# Patient Record
Sex: Female | Born: 1955 | Race: White | Hispanic: No | Marital: Single | Smoking: Former smoker
Health system: Southern US, Community
[De-identification: ages and names within clinical notes are randomized; demographics above are authoritative.]

## PROBLEM LIST (undated history)

## (undated) DIAGNOSIS — I10 Essential (primary) hypertension: Secondary | ICD-10-CM

## (undated) DIAGNOSIS — I219 Acute myocardial infarction, unspecified: Secondary | ICD-10-CM

## (undated) DIAGNOSIS — M1990 Unspecified osteoarthritis, unspecified site: Secondary | ICD-10-CM

## (undated) DIAGNOSIS — I519 Heart disease, unspecified: Secondary | ICD-10-CM

## (undated) DIAGNOSIS — E119 Type 2 diabetes mellitus without complications: Secondary | ICD-10-CM

## (undated) HISTORY — PX: CHOLECYSTECTOMY: SHX55

## (undated) HISTORY — PX: CARDIAC CATHETERIZATION: SHX172

---

## 2017-03-02 ENCOUNTER — Encounter: Payer: Self-pay | Admitting: Emergency Medicine

## 2017-03-02 ENCOUNTER — Other Ambulatory Visit: Payer: Self-pay

## 2017-03-02 ENCOUNTER — Emergency Department: Payer: BLUE CROSS/BLUE SHIELD

## 2017-03-02 ENCOUNTER — Inpatient Hospital Stay
Admission: EM | Admit: 2017-03-02 | Discharge: 2017-03-11 | DRG: 193 | Disposition: A | Discharge status: Home / Self Care | Payer: BLUE CROSS/BLUE SHIELD | Attending: Specialist | Admitting: Specialist

## 2017-03-02 DIAGNOSIS — J189 Pneumonia, unspecified organism: Secondary | ICD-10-CM | POA: Diagnosis present

## 2017-03-02 DIAGNOSIS — I10 Essential (primary) hypertension: Secondary | ICD-10-CM | POA: Diagnosis present

## 2017-03-02 DIAGNOSIS — Z7984 Long term (current) use of oral hypoglycemic drugs: Secondary | ICD-10-CM

## 2017-03-02 DIAGNOSIS — Z7982 Long term (current) use of aspirin: Secondary | ICD-10-CM

## 2017-03-02 DIAGNOSIS — J918 Pleural effusion in other conditions classified elsewhere: Secondary | ICD-10-CM | POA: Diagnosis present

## 2017-03-02 DIAGNOSIS — J440 Chronic obstructive pulmonary disease with acute lower respiratory infection: Secondary | ICD-10-CM | POA: Diagnosis present

## 2017-03-02 DIAGNOSIS — E119 Type 2 diabetes mellitus without complications: Secondary | ICD-10-CM | POA: Diagnosis present

## 2017-03-02 DIAGNOSIS — J9601 Acute respiratory failure with hypoxia: Secondary | ICD-10-CM

## 2017-03-02 DIAGNOSIS — I2510 Atherosclerotic heart disease of native coronary artery without angina pectoris: Secondary | ICD-10-CM | POA: Diagnosis present

## 2017-03-02 DIAGNOSIS — Z79899 Other long term (current) drug therapy: Secondary | ICD-10-CM

## 2017-03-02 DIAGNOSIS — Z794 Long term (current) use of insulin: Secondary | ICD-10-CM

## 2017-03-02 DIAGNOSIS — Z955 Presence of coronary angioplasty implant and graft: Secondary | ICD-10-CM

## 2017-03-02 DIAGNOSIS — E785 Hyperlipidemia, unspecified: Secondary | ICD-10-CM | POA: Diagnosis present

## 2017-03-02 DIAGNOSIS — J441 Chronic obstructive pulmonary disease with (acute) exacerbation: Secondary | ICD-10-CM | POA: Diagnosis present

## 2017-03-02 DIAGNOSIS — I252 Old myocardial infarction: Secondary | ICD-10-CM

## 2017-03-02 DIAGNOSIS — Z09 Encounter for follow-up examination after completed treatment for conditions other than malignant neoplasm: Secondary | ICD-10-CM

## 2017-03-02 DIAGNOSIS — J90 Pleural effusion, not elsewhere classified: Secondary | ICD-10-CM

## 2017-03-02 DIAGNOSIS — J181 Lobar pneumonia, unspecified organism: Secondary | ICD-10-CM

## 2017-03-02 DIAGNOSIS — Z87891 Personal history of nicotine dependence: Secondary | ICD-10-CM

## 2017-03-02 DIAGNOSIS — I4891 Unspecified atrial fibrillation: Secondary | ICD-10-CM | POA: Diagnosis present

## 2017-03-02 DIAGNOSIS — R0902 Hypoxemia: Secondary | ICD-10-CM

## 2017-03-02 DIAGNOSIS — J869 Pyothorax without fistula: Secondary | ICD-10-CM | POA: Diagnosis present

## 2017-03-02 HISTORY — DX: Unspecified osteoarthritis, unspecified site: M19.90

## 2017-03-02 HISTORY — DX: Heart disease, unspecified: I51.9

## 2017-03-02 HISTORY — DX: Essential (primary) hypertension: I10

## 2017-03-02 HISTORY — DX: Acute myocardial infarction, unspecified: I21.9

## 2017-03-02 HISTORY — DX: Type 2 diabetes mellitus without complications: E11.9

## 2017-03-02 LAB — BASIC METABOLIC PANEL
Anion gap: 15 (ref 5–15)
BUN: 17 mg/dL (ref 6–20)
CHLORIDE: 100 mmol/L — AB (ref 101–111)
CO2: 24 mmol/L (ref 22–32)
Calcium: 9.7 mg/dL (ref 8.9–10.3)
Creatinine, Ser: 0.95 mg/dL (ref 0.44–1.00)
GFR calc non Af Amer: >60 mL/min (ref >60)
Glucose, Bld: 229 mg/dL — ABNORMAL HIGH (ref 65–99)
POTASSIUM: 3.8 mmol/L (ref 3.5–5.1)
SODIUM: 139 mmol/L (ref 135–145)

## 2017-03-02 LAB — LACTIC ACID, PLASMA: Lactic Acid, Venous: 1.4 mmol/L (ref 0.5–1.9)

## 2017-03-02 LAB — CBC
HEMATOCRIT: 45.1 % (ref 35.0–47.0)
HEMOGLOBIN: 14.4 g/dL (ref 12.0–16.0)
MCH: 28 pg (ref 26.0–34.0)
MCHC: 32 g/dL (ref 32.0–36.0)
MCV: 87.6 fL (ref 80.0–100.0)
Platelets: 234 10*3/uL (ref 150–440)
RBC: 5.15 MIL/uL (ref 3.80–5.20)
RDW: 15.3 % — ABNORMAL HIGH (ref 11.5–14.5)
WBC: 16.2 10*3/uL — AB (ref 3.6–11.0)

## 2017-03-02 LAB — TROPONIN I: Troponin I: <0.03 ng/mL (ref <0.03)

## 2017-03-02 MED ORDER — IPRATROPIUM-ALBUTEROL 0.5-2.5 (3) MG/3ML IN SOLN
3.0000 mL | Freq: Once | RESPIRATORY_TRACT | Status: AC
  Administered 2017-03-02: 3 mL via RESPIRATORY_TRACT
  Filled 2017-03-02: qty 3

## 2017-03-02 MED ORDER — SODIUM CHLORIDE 0.9 % IV BOLUS (SEPSIS)
1000.0000 mL | Freq: Once | INTRAVENOUS | Status: AC
  Administered 2017-03-02: 1000 mL via INTRAVENOUS

## 2017-03-02 MED ORDER — DEXTROSE 5 % IV SOLN
500.0000 mg | Freq: Once | INTRAVENOUS | Status: AC
  Administered 2017-03-02: 500 mg via INTRAVENOUS
  Filled 2017-03-02: qty 500

## 2017-03-02 MED ORDER — FENTANYL CITRATE (PF) 100 MCG/2ML IJ SOLN
INTRAMUSCULAR | Status: AC
  Filled 2017-03-02: qty 2

## 2017-03-02 MED ORDER — CEFTRIAXONE SODIUM IN DEXTROSE 20 MG/ML IV SOLN
1.0000 g | Freq: Once | INTRAVENOUS | Status: AC
Start: 2017-03-02 — End: 2017-03-03
  Administered 2017-03-02: 1 g via INTRAVENOUS
  Filled 2017-03-02: qty 50

## 2017-03-02 MED ORDER — IOPAMIDOL (ISOVUE-370) INJECTION 76%
75.0000 mL | Freq: Once | INTRAVENOUS | Status: AC | PRN
  Administered 2017-03-02: 75 mL via INTRAVENOUS

## 2017-03-02 MED ORDER — FENTANYL CITRATE (PF) 100 MCG/2ML IJ SOLN
50.0000 ug | Freq: Once | INTRAMUSCULAR | Status: AC
  Administered 2017-03-02: 50 ug via INTRAVENOUS

## 2017-03-02 NOTE — ED Notes (Signed)
Patient transported to CT 

## 2017-03-02 NOTE — ED Triage Notes (Addendum)
Pt presents to ED with left side/flank pain. Pt states her pain starts just above her left breast and just below her left lower rib. Painful with palpation and movement. Denies injury. Pt states she does have a cardiac hx with previous MI and stent placement. Reports pain makes it difficult for her to take a deep breath. Pt alert and able to answer questions.

## 2017-03-02 NOTE — ED Provider Notes (Signed)
[facility] Emergency Department Provider Note    First MD Initiated Contact with Patient 03/02/17 2052     (approximate)  I have reviewed the triage vital signs and the nursing notes.   HISTORY  Chief Complaint Flank Pain and Chest Pain    HPI Erin Russell is a 61 y.o. female with a history of MI status post stent presenting via POV with sudden onset left flank and left chest pain associated with shortness of breath.  Patient [place]  States that for the past day or 2 after recent travel she has been having worsening shortness of breath and flank pain.  Pain became moderate to severe prior to arrival.  Denies any history of kidney stones.  No dysuria or hematuria.  Has felt generalized malaise but no medic measured fevers.  No productive cough.  She is not on any blood thinners.  No personal history of blood clots.  Past Medical History:  Diagnosis Date  . Arthritis   . Diabetes mellitus without complication (HCC)   . Heart disease   . Hypertension   . MI (myocardial infarction) (HCC)    History reviewed. No pertinent family history. Past Surgical History:  Procedure Laterality Date  . CARDIAC CATHETERIZATION    . CHOLECYSTECTOMY     There are no active problems to display for this patient.     Prior to Admission medications   Not on File    Allergies Codeine and Latex    Social History Social History   Tobacco Use  . Smoking status: Former Games developermoker  . Smokeless tobacco: Never Used  Substance Use Topics  . Alcohol use: Yes    Frequency: Never  . Drug use: No    Review of Systems Patient denies headaches, rhinorrhea, blurry vision, numbness, shortness of breath, chest pain, edema, cough, abdominal pain, nausea, vomiting, diarrhea, dysuria, fevers, rashes or hallucinations unless otherwise stated above in HPI. ____________________________________________   PHYSICAL EXAM:  VITAL  SIGNS: Vitals:   03/02/17 2012  BP: (!) 161/70  Pulse: 91  Resp: (!) 22  Temp: 98.6 F (37 C)  SpO2: 92%    Constitutional: Alert and oriented.  Uncomfortable appearing with moderate respiratory distress with mild tachypnea and hypoxia to 86% on room air which improved to the low 90s on 3 L of supplemental Eyes: Conjunctivae are normal.  Head: Atraumatic. Nose: No congestion/rhinnorhea. Mouth/Throat: Mucous membranes are moist.   Neck: No stridor. Painless ROM.  Cardiovascular: Normal rate, regular rhythm. Grossly normal heart sounds.  Good peripheral circulation. Respiratory: Mild tachypnea, diminished breath sounds on the left  gastrointestinal: Soft and nontender. No distention. No abdominal bruits. No CVA tenderness.  Musculoskeletal: No lower extremity tenderness nor edema.  No joint effusions. Neurologic:  Normal speech and language. No gross focal neurologic deficits are appreciated. No facial droop Skin:  Skin is warm, dry and intact. No rash noted. Psychiatric: Anxious appearing, organized thought process  ____________________________________________   LABS (all labs ordered are listed, but only abnormal results are displayed)  Results for orders placed or performed during the hospital encounter of 03/02/17 (from the past 24 hour(s))  Basic metabolic panel     Status: Abnormal   Collection Time: 03/02/17  8:28 PM  Result Value Ref Range   Sodium 139 135 - 145 mmol/L   Potassium 3.8 3.5 - 5.1 mmol/L   Chloride 100 (L) 101 - 111 mmol/L   CO2 24 22 -  32 mmol/L   Glucose, Bld 229 (H) 65 - 99 mg/dL   BUN 17 6 - 20 mg/dL   Creatinine, Ser 1.610.95 0.44 - 1.00 mg/dL   Calcium 9.7 8.9 - 09.610.3 mg/dL   GFR calc non Af Amer >60 >60 mL/min   GFR calc Af Amer >60 >60 mL/min   Anion gap 15 5 - 15  CBC     Status: Abnormal   Collection Time: 03/02/17  8:28 PM  Result Value Ref Range   WBC 16.2 (H) 3.6 - 11.0 K/uL   RBC 5.15 3.80 - 5.20 MIL/uL   Hemoglobin 14.4 12.0 - 16.0 g/dL    HCT 04.545.1 40.935.0 - 81.147.0 %   MCV 87.6 80.0 - 100.0 fL   MCH 28.0 26.0 - 34.0 pg   MCHC 32.0 32.0 - 36.0 g/dL   RDW 91.415.3 (H) 78.211.5 - 95.614.5 %   Platelets 234 150 - 440 K/uL  Troponin I     Status: None   Collection Time: 03/02/17  8:28 PM  Result Value Ref Range   Troponin I <0.03 <0.03 ng/mL   ____________________________________________  EKG My review and personal interpretation at Time: 20:04   Indication: chest pain  Rate: 85  Rhythm: sinus Axis: normal Other: no stemi, normal intervals, poor r wave progressions ____________________________________________  RADIOLOGY  I personally reviewed all radiographic images ordered to evaluate for the above acute complaints and reviewed radiology reports and findings.  These findings were personally discussed with the patient.  Please see medical record for radiology report.  ____________________________________________   PROCEDURES  Procedure(s) performed:  .Critical Care Performed by: Willy Eddyobinson, Jahniyah Revere, MD Authorized by: Willy Eddyobinson, Lillyanna Glandon, MD   Critical care provider statement:    Critical care time (minutes):  30   Critical care time was exclusive of:  Separately billable procedures and treating other patients   Critical care was necessary to treat or prevent imminent or life-threatening deterioration of the following conditions:  Respiratory failure   Critical care was time spent personally by me on the following activities:  Development of treatment plan with patient or surrogate, discussions with consultants, evaluation of patient's response to treatment, examination of patient, obtaining history from patient or surrogate, ordering and performing treatments and interventions, ordering and review of laboratory studies, ordering and review of radiographic studies, pulse oximetry, re-evaluation of patient's condition and review of old charts      Critical Care performed: no ____________________________________________   INITIAL  IMPRESSION / ASSESSMENT AND PLAN / ED COURSE  Pertinent labs & imaging results that were available during my care of the patient were reviewed by me and considered in my medical decision making (see chart for details).  DDX: Asthma, copd, CHF, pna, ptx, malignancy, Pe, anemia   Erin Russell is a 61 y.o. who presents to the ED with symptoms as described above.  Patient does have evidence of acute hypoxic respiratory failure therefore she was placed on supplemental oxygen will be given nebulizer treatments.  She is afebrile but chest x-ray is concerning for consolidation however given her lack of fever I am concern for pulmonary infarct or PE given her recent travel, pleuritic pain and distress therefore will order CT imaging to evaluate for the above differential.  Kidney stone also is on the differential seems less likely but will further evaluate with CT imaging to exclude other vascular event.  The patient will be placed on continuous pulse oximetry and telemetry for monitoring.  Laboratory evaluation will be sent to evaluate for  the above complaints.       CT and workup most consistent with lobar consolidation with parapneumonic effusion causing the patient's pain.  No evidence of pulmonary embolism.  Given her acute hypoxic respiratory failure requiring supplemental oxygen and evidence of lobar pneumonia patient will require admission the hospital for further medical management.  I have given broad-spectrum antibiotics and will continue IV resuscitation.  ____________________________________________   FINAL CLINICAL IMPRESSION(S) / ED DIAGNOSES  Final diagnoses:  Acute respiratory failure with hypoxia (HCC)  Community acquired pneumonia of left lower lobe of lung (HCC)      NEW MEDICATIONS STARTED DURING THIS VISIT:  This SmartLink is deprecated. Use AVSMEDLIST instead to display the medication list for a patient.   Note:  This document was prepared using Dragon voice  recognition software and may include unintentional dictation errors.    Willy Eddy, MD 03/02/17 2337

## 2017-03-02 NOTE — ED Notes (Signed)
Pt placed on 3L O2 via n/c to increase O2 sat to 94%

## 2017-03-03 ENCOUNTER — Encounter: Payer: Self-pay | Admitting: Internal Medicine

## 2017-03-03 ENCOUNTER — Other Ambulatory Visit: Payer: Self-pay

## 2017-03-03 DIAGNOSIS — I4891 Unspecified atrial fibrillation: Secondary | ICD-10-CM | POA: Diagnosis present

## 2017-03-03 DIAGNOSIS — E119 Type 2 diabetes mellitus without complications: Secondary | ICD-10-CM | POA: Diagnosis present

## 2017-03-03 DIAGNOSIS — J189 Pneumonia, unspecified organism: Secondary | ICD-10-CM | POA: Diagnosis present

## 2017-03-03 DIAGNOSIS — Z7984 Long term (current) use of oral hypoglycemic drugs: Secondary | ICD-10-CM | POA: Diagnosis not present

## 2017-03-03 DIAGNOSIS — J441 Chronic obstructive pulmonary disease with (acute) exacerbation: Secondary | ICD-10-CM | POA: Diagnosis present

## 2017-03-03 DIAGNOSIS — I10 Essential (primary) hypertension: Secondary | ICD-10-CM | POA: Diagnosis present

## 2017-03-03 DIAGNOSIS — Z79899 Other long term (current) drug therapy: Secondary | ICD-10-CM | POA: Diagnosis not present

## 2017-03-03 DIAGNOSIS — Z7982 Long term (current) use of aspirin: Secondary | ICD-10-CM | POA: Diagnosis not present

## 2017-03-03 DIAGNOSIS — J9601 Acute respiratory failure with hypoxia: Secondary | ICD-10-CM | POA: Diagnosis present

## 2017-03-03 DIAGNOSIS — J181 Lobar pneumonia, unspecified organism: Secondary | ICD-10-CM | POA: Diagnosis not present

## 2017-03-03 DIAGNOSIS — J440 Chronic obstructive pulmonary disease with acute lower respiratory infection: Secondary | ICD-10-CM | POA: Diagnosis present

## 2017-03-03 DIAGNOSIS — J918 Pleural effusion in other conditions classified elsewhere: Secondary | ICD-10-CM | POA: Diagnosis present

## 2017-03-03 DIAGNOSIS — E785 Hyperlipidemia, unspecified: Secondary | ICD-10-CM | POA: Diagnosis present

## 2017-03-03 DIAGNOSIS — J869 Pyothorax without fistula: Secondary | ICD-10-CM | POA: Diagnosis present

## 2017-03-03 DIAGNOSIS — I252 Old myocardial infarction: Secondary | ICD-10-CM | POA: Diagnosis not present

## 2017-03-03 DIAGNOSIS — J90 Pleural effusion, not elsewhere classified: Secondary | ICD-10-CM | POA: Diagnosis not present

## 2017-03-03 DIAGNOSIS — I2510 Atherosclerotic heart disease of native coronary artery without angina pectoris: Secondary | ICD-10-CM | POA: Diagnosis present

## 2017-03-03 DIAGNOSIS — Z87891 Personal history of nicotine dependence: Secondary | ICD-10-CM | POA: Diagnosis not present

## 2017-03-03 DIAGNOSIS — Z955 Presence of coronary angioplasty implant and graft: Secondary | ICD-10-CM | POA: Diagnosis not present

## 2017-03-03 LAB — CBC
HEMATOCRIT: 40.2 % (ref 35.0–47.0)
Hemoglobin: 13 g/dL (ref 12.0–16.0)
MCH: 28.3 pg (ref 26.0–34.0)
MCHC: 32.3 g/dL (ref 32.0–36.0)
MCV: 87.6 fL (ref 80.0–100.0)
PLATELETS: 206 10*3/uL (ref 150–440)
RBC: 4.6 MIL/uL (ref 3.80–5.20)
RDW: 15.6 % — AB (ref 11.5–14.5)
WBC: 15.4 10*3/uL — AB (ref 3.6–11.0)

## 2017-03-03 LAB — GLUCOSE, CAPILLARY
GLUCOSE-CAPILLARY: 214 mg/dL — AB (ref 65–99)
GLUCOSE-CAPILLARY: 226 mg/dL — AB (ref 65–99)
Glucose-Capillary: 230 mg/dL — ABNORMAL HIGH (ref 65–99)
Glucose-Capillary: 167 mg/dL — ABNORMAL HIGH (ref 65–99)
Glucose-Capillary: 313 mg/dL — ABNORMAL HIGH (ref 65–99)

## 2017-03-03 LAB — URINALYSIS, COMPLETE (UACMP) WITH MICROSCOPIC
BILIRUBIN URINE: NEGATIVE
Glucose, UA: >=500 mg/dL — AB
HGB URINE DIPSTICK: NEGATIVE
Ketones, ur: 5 mg/dL — AB
NITRITE: NEGATIVE
PROTEIN: NEGATIVE mg/dL
Specific Gravity, Urine: 1.043 — ABNORMAL HIGH (ref 1.005–1.030)
pH: 5 (ref 5.0–8.0)

## 2017-03-03 LAB — LACTIC ACID, PLASMA: LACTIC ACID, VENOUS: 1.6 mmol/L (ref 0.5–1.9)

## 2017-03-03 MED ORDER — IPRATROPIUM-ALBUTEROL 0.5-2.5 (3) MG/3ML IN SOLN
3.0000 mL | RESPIRATORY_TRACT | Status: DC
  Administered 2017-03-03 – 2017-03-04 (×8): 3 mL via RESPIRATORY_TRACT
  Filled 2017-03-03 (×9): qty 3

## 2017-03-03 MED ORDER — OXYCODONE-ACETAMINOPHEN 5-325 MG PO TABS
1.0000 | ORAL_TABLET | Freq: Four times a day (QID) | ORAL | Status: DC | PRN
  Administered 2017-03-03 – 2017-03-09 (×16): 1 via ORAL
  Filled 2017-03-03 (×17): qty 1

## 2017-03-03 MED ORDER — NITROGLYCERIN 0.4 MG SL SUBL: 0.4000 mg | SUBLINGUAL_TABLET | SUBLINGUAL | Status: DC | PRN

## 2017-03-03 MED ORDER — ASPIRIN EC 81 MG PO TBEC
81.0000 mg | DELAYED_RELEASE_TABLET | Freq: Every day | ORAL | Status: DC
  Administered 2017-03-03 – 2017-03-11 (×9): 81 mg via ORAL
  Filled 2017-03-03 (×9): qty 1

## 2017-03-03 MED ORDER — INSULIN GLARGINE 100 UNIT/ML ~~LOC~~ SOLN
20.0000 [IU] | Freq: Every day | SUBCUTANEOUS | Status: DC
  Administered 2017-03-03 – 2017-03-04 (×2): 20 [IU] via SUBCUTANEOUS
  Filled 2017-03-03 (×3): qty 0.2

## 2017-03-03 MED ORDER — LOSARTAN POTASSIUM 50 MG PO TABS
100.0000 mg | ORAL_TABLET | Freq: Every day | ORAL | Status: DC
  Administered 2017-03-03 – 2017-03-11 (×9): 100 mg via ORAL
  Filled 2017-03-03 (×9): qty 2

## 2017-03-03 MED ORDER — AMLODIPINE BESYLATE 10 MG PO TABS
10.0000 mg | ORAL_TABLET | Freq: Every day | ORAL | Status: DC
  Administered 2017-03-03 – 2017-03-11 (×10): 10 mg via ORAL
  Filled 2017-03-03 (×9): qty 1

## 2017-03-03 MED ORDER — CEFTRIAXONE SODIUM IN DEXTROSE 20 MG/ML IV SOLN
1.0000 g | INTRAVENOUS | Status: DC
  Administered 2017-03-03: 1 g via INTRAVENOUS
  Filled 2017-03-03 (×2): qty 50

## 2017-03-03 MED ORDER — VITAMIN D 1000 UNITS PO TABS
2000.0000 [IU] | ORAL_TABLET | Freq: Every day | ORAL | Status: DC
  Administered 2017-03-03 – 2017-03-11 (×9): 2000 [IU] via ORAL
  Filled 2017-03-03 (×9): qty 2

## 2017-03-03 MED ORDER — DEXTROSE 5 % IV SOLN
500.0000 mg | INTRAVENOUS | Status: DC
  Administered 2017-03-03 – 2017-03-04 (×2): 500 mg via INTRAVENOUS
  Filled 2017-03-03 (×2): qty 500

## 2017-03-03 MED ORDER — BISOPROLOL FUMARATE 5 MG PO TABS
10.0000 mg | ORAL_TABLET | Freq: Every day | ORAL | Status: DC
  Administered 2017-03-03 – 2017-03-07 (×5): 10 mg via ORAL
  Filled 2017-03-03 (×5): qty 2
  Filled 2017-03-03: qty 1
  Filled 2017-03-03: qty 2

## 2017-03-03 MED ORDER — ENOXAPARIN SODIUM 40 MG/0.4ML ~~LOC~~ SOLN
40.0000 mg | SUBCUTANEOUS | Status: DC
  Administered 2017-03-03 – 2017-03-10 (×8): 40 mg via SUBCUTANEOUS
  Filled 2017-03-03 (×8): qty 0.4

## 2017-03-03 MED ORDER — INSULIN ASPART 100 UNIT/ML ~~LOC~~ SOLN
0.0000 [IU] | Freq: Three times a day (TID) | SUBCUTANEOUS | Status: DC
  Administered 2017-03-03: 09:00:00 2 [IU] via SUBCUTANEOUS
  Administered 2017-03-03: 7 [IU] via SUBCUTANEOUS
  Administered 2017-03-03 – 2017-03-04 (×2): 3 [IU] via SUBCUTANEOUS
  Administered 2017-03-04: 08:00:00 1 [IU] via SUBCUTANEOUS
  Administered 2017-03-04: 5 [IU] via SUBCUTANEOUS
  Filled 2017-03-03 (×6): qty 1

## 2017-03-03 MED ORDER — DEXTROSE 5 % IV SOLN
INTRAVENOUS | Status: DC
  Filled 2017-03-03: qty 10

## 2017-03-03 MED ORDER — DEXTROSE 5 % IV SOLN: 250.0000 mg | INTRAVENOUS | Status: DC

## 2017-03-03 MED ORDER — ROSUVASTATIN CALCIUM 10 MG PO TABS
20.0000 mg | ORAL_TABLET | Freq: Every day | ORAL | Status: DC
  Administered 2017-03-03 – 2017-03-09 (×6): 20 mg via ORAL
  Filled 2017-03-03: qty 2
  Filled 2017-03-03: qty 1
  Filled 2017-03-03 (×6): qty 2

## 2017-03-03 MED ORDER — SODIUM CHLORIDE 0.9 % IV SOLN
INTRAVENOUS | Status: AC
  Administered 2017-03-03 – 2017-03-04 (×3): via INTRAVENOUS

## 2017-03-03 MED ORDER — MORPHINE SULFATE (PF) 2 MG/ML IV SOLN
2.0000 mg | INTRAVENOUS | Status: DC | PRN
  Administered 2017-03-03 (×4): 2 mg via INTRAVENOUS
  Filled 2017-03-03 (×4): qty 1

## 2017-03-03 MED ORDER — ACETAMINOPHEN 325 MG PO TABS
650.0000 mg | ORAL_TABLET | Freq: Four times a day (QID) | ORAL | Status: DC | PRN
  Administered 2017-03-03: 09:00:00 650 mg via ORAL
  Filled 2017-03-03: qty 2

## 2017-03-03 NOTE — Progress Notes (Signed)
[facility] [place] [facility]   PATIENT NAME: Erin Russell    MR#:  [id number]  DATE OF BIRTH:  01/17/1956  SUBJECTIVE:  CHIEF COMPLAINT:  Patient is reporting chest pain with deep inspirations. Shortness of breath is better than yesterday. Patient is from [place]  REVIEW OF SYSTEMS:  CONSTITUTIONAL: No fever, fatigue or weakness.  EYES: No blurred or double vision.  EARS, NOSE, AND THROAT: No tinnitus or ear pain.  RESPIRATORY: No cough, reporting exertional shortness of breath, denies wheezing or hemoptysis.  CARDIOVASCULAR: Patient is reporting chest pain with deep inspirations , denies orthopnea, edema.  GASTROINTESTINAL: No nausea, vomiting, diarrhea or abdominal pain.  GENITOURINARY: No dysuria, hematuria.  ENDOCRINE: No polyuria, nocturia,  HEMATOLOGY: No anemia, easy bruising or bleeding SKIN: No rash or lesion. MUSCULOSKELETAL: No joint pain or arthritis.   NEUROLOGIC: No tingling, numbness, weakness.  PSYCHIATRY: No anxiety or depression.   DRUG ALLERGIES:   Allergies  Allergen Reactions  . Codeine   . Latex Hives and Swelling    VITALS:  Blood pressure (!) 117/49, pulse 96, temperature 99.9 F (37.7 C), temperature source Oral, resp. rate (!) 23, height 5\' 5"  (1.651 m), weight 109.1 kg (240 lb 8 oz), SpO2 (!) 88 %.  PHYSICAL EXAMINATION:  GENERAL:  61 y.o.-year-old patient lying in the bed with no acute distress.  EYES: Pupils equal, round, reactive to light and accommodation. No scleral icterus. Extraocular muscles intact.  HEENT: Head atraumatic, normocephalic. Oropharynx and nasopharynx clear.  NECK:  Supple, no jugular venous distention. No thyroid enlargement, no tenderness.  LUNGS: Moderate breath sounds bilaterally, no wheezing, rales,rhonchi or crepitation. No use of accessory muscles of respiration.  CARDIOVASCULAR: S1, S2 normal. No murmurs, rubs, or gallops.  ABDOMEN: Soft, nontender, nondistended. Bowel sounds  present. No organomegaly or mass.  EXTREMITIES: No pedal edema, cyanosis, or clubbing.  NEUROLOGIC: Cranial nerves II through XII are intact. Muscle strength 5/5 in all extremities. Sensation intact. Gait not checked.  PSYCHIATRIC: The patient is alert and oriented x 3.  SKIN: No obvious rash, lesion, or ulcer.    LABORATORY PANEL:   CBC Recent Labs  Lab 03/03/17 0607  WBC 15.4*  HGB 13.0  HCT 40.2  PLT 206   ------------------------------------------------------------------------------------------------------------------  Chemistries  Recent Labs  Lab 03/02/17 2028  NA 139  K 3.8  CL 100*  CO2 24  GLUCOSE 229*  BUN 17  CREATININE 0.95  CALCIUM 9.7   ------------------------------------------------------------------------------------------------------------------  Cardiac Enzymes Recent Labs  Lab 03/02/17 2028  TROPONINI <0.03   ------------------------------------------------------------------------------------------------------------------  RADIOLOGY:  Dg Chest 2 View  Result Date: 03/02/2017 CLINICAL DATA:  Left-sided chest pain EXAM: CHEST  2 VIEW COMPARISON:  None. FINDINGS: Cardiac shadow is within normal limits. The lungs are well aerated bilaterally. Mild left basilar infiltrate is seen. No focal bony abnormality is noted. IMPRESSION: Mild left basilar infiltrate. Electronically Signed   By: Mark  Lukens M.D.   On: 03/02/2017 20:51   Ct Angio Chest Pe W And/or Wo Contrast  Result Date: 03/02/2017 CLINICAL DATA:  61 y/o F; left-sided flank and lower chest pain. PE suspected. EXAM: CT ANGIOGRAPHY CHEST WITH CONTRAST TECHNIQUE: Multidetector CT imaging of the chest was performed using the standard protocol during bolus administration of intravenous contrast. Multiplanar CT image reconstructions and MIPs were obtained to evaluate the vascular anatomy. CONTRAST:  28mL ISOVUE-370 IOPAMIDOL (ISOVUE-370) INJECTION 76% COMPARISON:  03/02/2017 chest radiograph  FINDINGS: Cardiovascular: Normal caliber thoracic aorta with mild calcific atherosclerosis. Main pulmonary  artery measures 3.5 cm. Mild coronary artery calcification. Normal heart size. No pericardial effusion. RV/ LV equals 0.55. Satisfactory opacification of pulmonary arteries, mild respiratory motion artifact, no pulmonary embolus identified. Mediastinum/Nodes: No enlarged mediastinal, hilar, or axillary lymph nodes. Thyroid gland, trachea, and esophagus demonstrate no significant findings. Lungs/Pleura: Small left lower lobe basal lateral and basal posterior consolidation with small effusion probably representing pneumonia. Platelike atelectasis in the right lung base. Upper Abdomen: Bilateral adrenal nodules measuring less than 10 HU, 24 mm on the left and 26 mm on the right, compatible with adenoma. Musculoskeletal: No chest wall abnormality. No acute or significant osseous findings. Review of the MIP images confirms the above findings. IMPRESSION: 1. Mild respiratory motion artifact. No pulmonary embolus identified. 2. Small left lower lobe basilar consolidation and small left effusion likely representing pneumonia. 3. Bilateral adrenal adenomas. 4. Mild aortic and coronary artery calcification. 5. Enlarged main pulmonary artery may represent pulmonary artery hypertension. Electronically Signed   By: Mitzi HansenLance  Furusawa-Stratton M.D.   On: 03/02/2017 22:34   Ct Renal Stone Study  Result Date: 03/02/2017 CLINICAL DATA:  LEFT flank pain. Admission for radiation. Suspect kidney disease. EXAM: CT ABDOMEN AND PELVIS WITHOUT CONTRAST TECHNIQUE: Multidetector CT imaging of the abdomen and pelvis was performed following the standard protocol without IV contrast. COMPARISON:  None. FINDINGS: LOWER CHEST: Small LEFT pleural effusion and consolidation. Included heart size is normal. No pericardial effusion. Mild coronary artery calcifications. HEPATOBILIARY: Status post cholecystectomy. Borderline hepatomegaly.  PANCREAS: Normal. SPLEEN: Normal. ADRENALS/URINARY TRACT: Kidneys are orthotopic, demonstrating normal size and morphology. Punctate LEFT upper pole nephrolithiasis. 2.1 cm benign-appearing RIGHT interpolar cyst. 17 mm RIGHT upper pole cyst. No hydronephrosis; limited assessment for renal masses on this nonenhanced examination. The unopacified ureters are normal in course and caliber. Urinary bladder is well distended and unremarkable. Bilateral adrenal nodules, less than 10 Hounsfield units consistent with benign adenomas measuring 2.5 cm on the RIGHT, 2.2 cm on the LEFT. STOMACH/BOWEL: The stomach, small and large bowel are normal in course and caliber without inflammatory changes, sensitivity decreased by lack of enteric contrast. Severe descending and sigmoid colonic diverticulosis. Mild remaining colonic diverticulosis. Normal appendix. VASCULAR/LYMPHATIC: Aortoiliac vessels are normal in course and caliber. Moderate calcific atherosclerosis. No lymphadenopathy by CT size criteria. REPRODUCTIVE: Normal. OTHER: No intraperitoneal free fluid or free air. MUSCULOSKELETAL: Non-acute. Congenital canal narrowing. Minimal grade 1 L4-5 anterolisthesis. Severe lower lumbar facet arthropathy. Moderate canal stenosis L3-4. IMPRESSION: 1. Punctate nonobstructing LEFT nephrolithiasis. 2. Severe descending and sigmoid colonic diverticulosis without acute diverticulitis nor acute intra-abdominal/pelvic process. 3. Small LEFT pleural effusion and LEFT lower lobe consolidation, please see CT of chest from same day, reported separately. Aortic Atherosclerosis (ICD10-I70.0). Electronically Signed   By: Awilda Metroourtnay  Bloomer M.D.   On: 03/02/2017 22:37    EKG:   Orders placed or performed during the hospital encounter of 03/02/17  . ED EKG within 10 minutes  . ED EKG within 10 minutes  . EKG 12-Lead  . EKG 12-Lead    ASSESSMENT AND PLAN:     * Ac respi failure with hypoxia secondary to pneumonia Patient is on 5 L of  oxygen, wean off as tolerated  IS      *Pneumonia with pleuritic chest pain   IV rocephin+ Azithromycin    Bl cx pending    Oxygen supplement and Duoneb.   * Hx of smoking But quit smoking 3 years ago   Denies COPD, will give duoneb now, try to taper oxygen.  * DM  ISs. Hold oral meds Add Lantus 20 units Check hemoglobin A1c  * Htn    Cont home meds, hold diuretics  * Hyperlipidemia    Cont statin.  * Hx of CAD    Cont home meds bisoprolol, Cozaar, statin Continue aspirin     All the records are reviewed and case discussed with Care Management/Social Workerr. Management plans discussed with the patient, family and they are in agreement.  CODE STATUS: FC  TOTAL TIME TAKING CARE OF THIS PATIENT: 36  minutes.   POSSIBLE D/C IN 1-2  DAYS, DEPENDING ON CLINICAL CONDITION.  Note: This dictation was prepared with Dragon dictation along with smaller phrase technology. Any transcriptional errors that result from this process are unintentional.   Ramonita Lab M.D on 03/03/2017 at 4:36 PM  Between 7am to 6pm - Pager - [phone] After 6pm go to [email or web address] - password [email or web address]  [facility]  Office  [phone]  CC: Primary care physician; System, Pcp Not In

## 2017-03-03 NOTE — Progress Notes (Addendum)
Inpatient Diabetes Program Recommendations  AACE/ADA: New Consensus Statement on Inpatient Glycemic Control (2015)  Target Ranges:  Prepandial:   less than 140 mg/dL      Peak postprandial:   less than 180 mg/dL (1-2 hours)      Critically ill patients:  140 - 180 mg/dL   Lab Results  Component Value Date   GLUCAP 313 (H) 03/03/2017    Review of Glycemic ControlResults for Erin Russell, Erin (MRN [id number]) as of 03/03/2017 12:09  Ref. Range 03/03/2017 02:10 03/03/2017 07:55 03/03/2017 11:47  Glucose-Capillary Latest Ref Range: 65 - 99 mg/dL 782230 (H) 956167 (H) 213313 (H)    Diabetes history: Type 2 DM Outpatient Diabetes medications: Metformin 1000 mg bid, Farxiga 10 mg q AM, Trulicity 1.5 mg weekly Current orders for Inpatient glycemic control:  Novolog sensitive tid with meals and HS  Inpatient Diabetes Program Recommendations:    Please consider adding Lantus 20 units daily while in the hospital.  If CBG's remain elevated at lunch and dinner, consider adding Novolog meal coverage 4 units tid with meals.  Please also check A1C to determine pre hospitalization glycemic control.   Thanks, Beryl MeagerJenny Ketan Renz, RN, BC-ADM Inpatient Diabetes Coordinator Pager [phone] (8a-5p)

## 2017-03-03 NOTE — H&P (Signed)
[facility] -  at [facility]   PATIENT NAME: Erin Russell    MR#:  [id number]  DATE OF BIRTH:  01/24/1956  DATE OF ADMISSION:  03/02/2017  PRIMARY CARE PHYSICIAN: System, Pcp Not In   REQUESTING/REFERRING PHYSICIAN: Roxan Hockeyobinson  CHIEF COMPLAINT:   Chief Complaint  Patient presents with  . Flank Pain  . Chest Pain    HISTORY OF PRESENT ILLNESS: Erin HemanDeborah Selover  is a 61 y.o. female with a known history of arthritis, CAD, Htn- started left lower chest ,  mid left back pain for 1-2 days. [place]. Denies any fever or cough. Noted to be Hypoxic in ER. CT showed Pneumonia. She is advised to be admitted for that  PAST MEDICAL HISTORY:    Past Medical History:  Diagnosis Date  . Arthritis   . Diabetes mellitus without complication (HCC)   . Heart disease   . Hypertension   . MI (myocardial infarction) (HCC)     PAST SURGICAL HISTORY:  Past Surgical History:  Procedure Laterality Date  . CARDIAC CATHETERIZATION    . CHOLECYSTECTOMY      SOCIAL HISTORY:  Social History   Tobacco Use  . Smoking status: Former Games developermoker  . Smokeless tobacco: Never Used  Substance Use Topics  . Alcohol use: Yes    Frequency: Never    FAMILY HISTORY:  Family History  Problem Relation Age of Onset  . CAD Mother   . CAD Father     DRUG ALLERGIES:  Allergies  Allergen Reactions  . Codeine   . Latex Hives and Swelling    REVIEW OF SYSTEMS:   CONSTITUTIONAL: No fever, fatigue or weakness.  EYES: No blurred or double vision.  EARS, NOSE, AND THROAT: No tinnitus or ear pain.  RESPIRATORY: No cough, shortness of breath, wheezing or hemoptysis.  CARDIOVASCULAR: positive for chest pain,no orthopnea, edema.  GASTROINTESTINAL: No nausea, vomiting, diarrhea or abdominal pain.  GENITOURINARY: No dysuria, hematuria.  ENDOCRINE: No polyuria, nocturia,  HEMATOLOGY: No anemia, easy bruising or bleeding SKIN: No rash or lesion. MUSCULOSKELETAL: No  joint pain or arthritis.   NEUROLOGIC: No tingling, numbness, weakness.  PSYCHIATRY: No anxiety or depression.   MEDICATIONS AT HOME:  Prior to Admission medications   Medication Sig Start Date End Date Taking? Authorizing Provider  amLODipine (NORVASC) 10 MG tablet Take 1 tablet by mouth daily. In the morning 12/27/16  Yes [provider]  aspirin EC 81 MG tablet Take 81 mg by mouth daily.   Yes [provider]  bisoprolol (ZEBETA) 10 MG tablet Take 10 mg by mouth daily. In the afternoon 01/06/17  Yes [provider]  cholecalciferol (VITAMIN D) 1000 units tablet Take 2,000 Units by mouth daily. In the morning   Yes [provider]  FARXIGA 10 MG TABS tablet Take 10 mg by mouth daily. In the morning 01/29/17  Yes [provider]  hydrochlorothiazide (HYDRODIURIL) 12.5 MG tablet Take 1 tablet by mouth daily. In the afternoon 12/01/16  Yes [provider]  losartan (COZAAR) 100 MG tablet Take 100 mg by mouth daily. In the evening 02/07/17  Yes [provider]  meloxicam (MOBIC) 15 MG tablet meloxicam 15 mg tablet  TAKE 1 TABLET BY MOUTH EVERY DAY   Yes [provider]  metFORMIN (GLUCOPHAGE) 1000 MG tablet metformin 1,000 mg tablet  TAKE 1 TABLET TWICE A DAY WITH MEALS   Yes [provider]  nitroGLYCERIN (NITROSTAT) 0.4 MG SL tablet nitroglycerin 0.4  mg sublingual tablet  PLACE 1 TABLET UNDER TONGUE ONCE A DAY IF CHEST PAIN RECURS,MAX 1 TABLET IN 24 HOURS PERIOD   Yes [provider]  rosuvastatin (CRESTOR) 20 MG tablet Take 20 mg by mouth daily. In the evening 01/09/17  Yes [provider]  TRULICITY 1.5 MG/0.5ML SOPN Inject 0.5 mLs into the skin once a week. 01/03/17  Yes [provider]      PHYSICAL EXAMINATION:   VITAL SIGNS: Blood pressure (!) 153/65, pulse 94, temperature 98.6 F (37 C), temperature source Oral, resp. rate (!) 24, height 5\' 5"  (1.651 m), weight 108.9 kg (240 lb),  SpO2 94 %.  GENERAL:  61 y.o.-year-old patient lying in the bed with no acute distress.  EYES: Pupils equal, round, reactive to light and accommodation. No scleral icterus. Extraocular muscles intact.  HEENT: Head atraumatic, normocephalic. Oropharynx and nasopharynx clear.  NECK:  Supple, no jugular venous distention. No thyroid enlargement, no tenderness.  LUNGS: Normal breath sounds bilaterally, no wheezing, have crepitation. positive use of accessory muscles of respiration. Sitting up in bed and can not finish full sentense without distress. CARDIOVASCULAR: S1, S2 normal. No murmurs, rubs, or gallops.  ABDOMEN: Soft, nontender, nondistended. Bowel sounds present. No organomegaly or mass.  EXTREMITIES: No pedal edema, cyanosis, or clubbing.  NEUROLOGIC: Cranial nerves II through XII are intact. Muscle strength 5/5 in all extremities. Sensation intact. Gait not checked.  PSYCHIATRIC: The patient is alert and oriented x 3.  SKIN: No obvious rash, lesion, or ulcer.   LABORATORY PANEL:   CBC Recent Labs  Lab 03/02/17 2028  WBC 16.2*  HGB 14.4  HCT 45.1  PLT 234  MCV 87.6  MCH 28.0  MCHC 32.0  RDW 15.3*   ------------------------------------------------------------------------------------------------------------------  Chemistries  Recent Labs  Lab 03/02/17 2028  NA 139  K 3.8  CL 100*  CO2 24  GLUCOSE 229*  BUN 17  CREATININE 0.95  CALCIUM 9.7   ------------------------------------------------------------------------------------------------------------------ estimated creatinine clearance is 76.4 mL/min (by C-G formula based on SCr of 0.95 mg/dL). ------------------------------------------------------------------------------------------------------------------ No results for input(s): TSH, T4TOTAL, T3FREE, THYROIDAB in the last 72 hours.  Invalid input(s): FREET3   Coagulation profile No results for input(s): INR, PROTIME in the last 168  hours. ------------------------------------------------------------------------------------------------------------------- No results for input(s): DDIMER in the last 72 hours. -------------------------------------------------------------------------------------------------------------------  Cardiac Enzymes Recent Labs  Lab 03/02/17 2028  TROPONINI <0.03   ------------------------------------------------------------------------------------------------------------------ Invalid input(s): POCBNP  ---------------------------------------------------------------------------------------------------------------  Urinalysis    Component Value Date/Time   COLORURINE YELLOW (A) 03/02/2017 2353   APPEARANCEUR CLEAR (A) 03/02/2017 2353   LABSPEC 1.043 (H) 03/02/2017 2353   PHURINE 5.0 03/02/2017 2353   GLUCOSEU >=500 (A) 03/02/2017 2353   HGBUR NEGATIVE 03/02/2017 2353   BILIRUBINUR NEGATIVE 03/02/2017 2353   KETONESUR 5 (A) 03/02/2017 2353   PROTEINUR NEGATIVE 03/02/2017 2353   NITRITE NEGATIVE 03/02/2017 2353   LEUKOCYTESUR TRACE (A) 03/02/2017 2353     RADIOLOGY: Dg Chest 2 View  Result Date: 03/02/2017 CLINICAL DATA:  Left-sided chest pain EXAM: CHEST  2 VIEW COMPARISON:  None. FINDINGS: Cardiac shadow is within normal limits. The lungs are well aerated bilaterally. Mild left basilar infiltrate is seen. No focal bony abnormality is noted. IMPRESSION: Mild left basilar infiltrate. Electronically Signed   By: Alcide Clever M.D.   On: 03/02/2017 20:51   Ct Angio Chest Pe W And/or Wo Contrast  Result Date: 03/02/2017 CLINICAL DATA:  61 y/o F; left-sided flank and lower chest pain. PE suspected. EXAM: CT ANGIOGRAPHY CHEST  WITH CONTRAST TECHNIQUE: Multidetector CT imaging of the chest was performed using the standard protocol during bolus administration of intravenous contrast. Multiplanar CT image reconstructions and MIPs were obtained to evaluate the vascular anatomy. CONTRAST:  75mL  ISOVUE-370 IOPAMIDOL (ISOVUE-370) INJECTION 76% COMPARISON:  03/02/2017 chest radiograph FINDINGS: Cardiovascular: Normal caliber thoracic aorta with mild calcific atherosclerosis. Main pulmonary artery measures 3.5 cm. Mild coronary artery calcification. Normal heart size. No pericardial effusion. RV/ LV equals 0.55. Satisfactory opacification of pulmonary arteries, mild respiratory motion artifact, no pulmonary embolus identified. Mediastinum/Nodes: No enlarged mediastinal, hilar, or axillary lymph nodes. Thyroid gland, trachea, and esophagus demonstrate no significant findings. Lungs/Pleura: Small left lower lobe basal lateral and basal posterior consolidation with small effusion probably representing pneumonia. Platelike atelectasis in the right lung base. Upper Abdomen: Bilateral adrenal nodules measuring less than 10 HU, 24 mm on the left and 26 mm on the right, compatible with adenoma. Musculoskeletal: No chest wall abnormality. No acute or significant osseous findings. Review of the MIP images confirms the above findings. IMPRESSION: 1. Mild respiratory motion artifact. No pulmonary embolus identified. 2. Small left lower lobe basilar consolidation and small left effusion likely representing pneumonia. 3. Bilateral adrenal adenomas. 4. Mild aortic and coronary artery calcification. 5. Enlarged main pulmonary artery may represent pulmonary artery hypertension. Electronically Signed   By: Mitzi HansenLance  Furusawa-Stratton M.D.   On: 03/02/2017 22:34   Ct Renal Stone Study  Result Date: 03/02/2017 CLINICAL DATA:  LEFT flank pain. Admission for radiation. Suspect kidney disease. EXAM: CT ABDOMEN AND PELVIS WITHOUT CONTRAST TECHNIQUE: Multidetector CT imaging of the abdomen and pelvis was performed following the standard protocol without IV contrast. COMPARISON:  None. FINDINGS: LOWER CHEST: Small LEFT pleural effusion and consolidation. Included heart size is normal. No pericardial effusion. Mild coronary artery  calcifications. HEPATOBILIARY: Status post cholecystectomy. Borderline hepatomegaly. PANCREAS: Normal. SPLEEN: Normal. ADRENALS/URINARY TRACT: Kidneys are orthotopic, demonstrating normal size and morphology. Punctate LEFT upper pole nephrolithiasis. 2.1 cm benign-appearing RIGHT interpolar cyst. 17 mm RIGHT upper pole cyst. No hydronephrosis; limited assessment for renal masses on this nonenhanced examination. The unopacified ureters are normal in course and caliber. Urinary bladder is well distended and unremarkable. Bilateral adrenal nodules, less than 10 Hounsfield units consistent with benign adenomas measuring 2.5 cm on the RIGHT, 2.2 cm on the LEFT. STOMACH/BOWEL: The stomach, small and large bowel are normal in course and caliber without inflammatory changes, sensitivity decreased by lack of enteric contrast. Severe descending and sigmoid colonic diverticulosis. Mild remaining colonic diverticulosis. Normal appendix. VASCULAR/LYMPHATIC: Aortoiliac vessels are normal in course and caliber. Moderate calcific atherosclerosis. No lymphadenopathy by CT size criteria. REPRODUCTIVE: Normal. OTHER: No intraperitoneal free fluid or free air. MUSCULOSKELETAL: Non-acute. Congenital canal narrowing. Minimal grade 1 L4-5 anterolisthesis. Severe lower lumbar facet arthropathy. Moderate canal stenosis L3-4. IMPRESSION: 1. Punctate nonobstructing LEFT nephrolithiasis. 2. Severe descending and sigmoid colonic diverticulosis without acute diverticulitis nor acute intra-abdominal/pelvic process. 3. Small LEFT pleural effusion and LEFT lower lobe consolidation, please see CT of chest from same day, reported separately. Aortic Atherosclerosis (ICD10-I70.0). Electronically Signed   By: Awilda Metroourtnay  Bloomer M.D.   On: 03/02/2017 22:37    EKG: Orders placed or performed during the hospital encounter of 03/02/17  . ED EKG within 10 minutes  . ED EKG within 10 minutes  . EKG 12-Lead  . EKG 12-Lead    IMPRESSION AND PLAN:  *  Ac respi failure with hypoxia    Pneumonia    IV rocephin+ Azithromycin    Bl cx sent.  Oxygen supplement and Duoneb. Try to taper.  * Hx of smoking   Denies COPD, will give duoneb now, try to taper oxygen.  * DM   ISs. Hold oral meds  * Htn    Cont home meds, hold diuretics  * Hyperlipidemia    Cont statin.  * Hx of CAD    Cont home meds.  All the records are reviewed and case discussed with ED provider. Management plans discussed with the patient, family and they are in agreement.  CODE STATUS: Full. Code Status History    This patient does not have a recorded code status. Please follow your organizational policy for patients in this situation.      TOTAL TIME TAKING CARE OF THIS PATIENT: 50 minutes.    Altamese Dilling M.D on 03/03/2017   Between 7am to 6pm - Pager - [phone]  After 6pm go to [email or web address] - password [email or web address]  [facility] [place] [facility]  Office  [phone]  CC: Primary care physician; System, Pcp Not In   Note: This dictation was prepared with Dragon dictation along with smaller phrase technology. Any transcriptional errors that result from this process are unintentional.

## 2017-03-03 NOTE — Progress Notes (Signed)
Pharmacy Antibiotic Note  Erin Russell is a 61 y.o. female admitted on 03/02/2017 with pneumonia.  Pharmacy has been consulted for azithro/ceftriaxone dosing.  Plan: Patient received azithro 500 mg and ceftriaxone 1g IV x 1 in ED  Will continue azithromycin 500 mg and ceftriaxone 1g IV daily for 5 - 7. Monitor for resolution of s/sx of PNA  Height: 5\' 5"  (165.1 cm) Weight: 240 lb 8 oz (109.1 kg) IBW/kg (Calculated) : 57  Temp (24hrs), Avg:98.8 F (37.1 C), Min:98.6 F (37 C), Max:98.9 F (37.2 C)  Recent Labs  Lab 03/02/17 2028 03/02/17 2306 03/03/17 0219  WBC 16.2*  --   --   CREATININE 0.95  --   --   LATICACIDVEN  --  1.4 1.6    Estimated Creatinine Clearance: 76.4 mL/min (by C-G formula based on SCr of 0.95 mg/dL).    Allergies  Allergen Reactions  . Codeine   . Latex Hives and Swelling    Thank you for allowing pharmacy to be a part of this patient's care.  Thomasene Rippleavid Yakelin Grenier, PharmD, BCPS Clinical Pharmacist 03/03/2017

## 2017-03-04 ENCOUNTER — Inpatient Hospital Stay: Payer: BLUE CROSS/BLUE SHIELD

## 2017-03-04 DIAGNOSIS — J9601 Acute respiratory failure with hypoxia: Secondary | ICD-10-CM

## 2017-03-04 DIAGNOSIS — J90 Pleural effusion, not elsewhere classified: Secondary | ICD-10-CM

## 2017-03-04 LAB — CBC
HEMATOCRIT: 37.4 % (ref 35.0–47.0)
Hemoglobin: 12 g/dL (ref 12.0–16.0)
MCH: 28.3 pg (ref 26.0–34.0)
MCHC: 32.2 g/dL (ref 32.0–36.0)
MCV: 88 fL (ref 80.0–100.0)
Platelets: 210 10*3/uL (ref 150–440)
RBC: 4.25 MIL/uL (ref 3.80–5.20)
RDW: 15.6 % — AB (ref 11.5–14.5)
WBC: 18.3 10*3/uL — ABNORMAL HIGH (ref 3.6–11.0)

## 2017-03-04 LAB — BASIC METABOLIC PANEL
ANION GAP: 7 (ref 5–15)
BUN: 21 mg/dL — ABNORMAL HIGH (ref 6–20)
CALCIUM: 8.4 mg/dL — AB (ref 8.9–10.3)
CHLORIDE: 105 mmol/L (ref 101–111)
CO2: 24 mmol/L (ref 22–32)
CREATININE: 0.91 mg/dL (ref 0.44–1.00)
GFR calc Af Amer: >60 mL/min (ref >60)
GFR calc non Af Amer: >60 mL/min (ref >60)
GLUCOSE: 157 mg/dL — AB (ref 65–99)
Potassium: 4.2 mmol/L (ref 3.5–5.1)
Sodium: 136 mmol/L (ref 135–145)

## 2017-03-04 LAB — BLOOD GAS, ARTERIAL
Acid-base deficit: 1.6 mmol/L (ref 0.0–2.0)
BICARBONATE: 22.9 mmol/L (ref 20.0–28.0)
FIO2: 0.44
O2 Saturation: 84.2 %
PATIENT TEMPERATURE: 37
PCO2 ART: 37 mmHg (ref 32.0–48.0)
PO2 ART: 49 mmHg — AB (ref 83.0–108.0)
pH, Arterial: 7.4 (ref 7.350–7.450)

## 2017-03-04 LAB — GLUCOSE, CAPILLARY
GLUCOSE-CAPILLARY: 261 mg/dL — AB (ref 65–99)
Glucose-Capillary: 137 mg/dL — ABNORMAL HIGH (ref 65–99)
Glucose-Capillary: 214 mg/dL — ABNORMAL HIGH (ref 65–99)
Glucose-Capillary: 339 mg/dL — ABNORMAL HIGH (ref 65–99)

## 2017-03-04 LAB — URINE CULTURE: Special Requests: NORMAL

## 2017-03-04 MED ORDER — VANCOMYCIN HCL IN DEXTROSE 1-5 GM/200ML-% IV SOLN
1000.0000 mg | Freq: Once | INTRAVENOUS | Status: DC
  Filled 2017-03-04: qty 200

## 2017-03-04 MED ORDER — SODIUM CHLORIDE 0.9 % IV SOLN
INTRAVENOUS | Status: DC
  Administered 2017-03-05: 1.9 [IU]/h via INTRAVENOUS
  Filled 2017-03-04 (×2): qty 1

## 2017-03-04 MED ORDER — SODIUM CHLORIDE 0.9% FLUSH: 3.0000 mL | INTRAVENOUS | Status: DC | PRN

## 2017-03-04 MED ORDER — PIPERACILLIN-TAZOBACTAM 3.375 G IVPB
3.3750 g | Freq: Three times a day (TID) | INTRAVENOUS | Status: DC
  Administered 2017-03-04 – 2017-03-08 (×12): 3.375 g via INTRAVENOUS
  Filled 2017-03-04 (×12): qty 50

## 2017-03-04 MED ORDER — SODIUM CHLORIDE 0.9 % IV SOLN
1250.0000 mg | Freq: Two times a day (BID) | INTRAVENOUS | Status: DC
  Administered 2017-03-04 – 2017-03-05 (×3): 1250 mg via INTRAVENOUS
  Filled 2017-03-04 (×6): qty 1250

## 2017-03-04 MED ORDER — METHYLPREDNISOLONE SODIUM SUCC 125 MG IJ SOLR
60.0000 mg | Freq: Two times a day (BID) | INTRAMUSCULAR | Status: DC
  Administered 2017-03-04 – 2017-03-06 (×5): 60 mg via INTRAVENOUS
  Filled 2017-03-04 (×5): qty 2

## 2017-03-04 MED ORDER — ORAL CARE MOUTH RINSE
15.0000 mL | Freq: Two times a day (BID) | OROMUCOSAL | Status: DC
  Administered 2017-03-04 – 2017-03-10 (×7): 15 mL via OROMUCOSAL

## 2017-03-04 MED ORDER — IPRATROPIUM-ALBUTEROL 0.5-2.5 (3) MG/3ML IN SOLN
3.0000 mL | Freq: Four times a day (QID) | RESPIRATORY_TRACT | Status: DC
  Administered 2017-03-04 – 2017-03-06 (×7): 3 mL via RESPIRATORY_TRACT
  Filled 2017-03-04 (×7): qty 3

## 2017-03-04 MED ORDER — SODIUM CHLORIDE 0.9% FLUSH
3.0000 mL | Freq: Two times a day (BID) | INTRAVENOUS | Status: DC
  Administered 2017-03-05 – 2017-03-11 (×11): 3 mL via INTRAVENOUS

## 2017-03-04 MED ORDER — CYCLOBENZAPRINE HCL 10 MG PO TABS
10.0000 mg | ORAL_TABLET | Freq: Three times a day (TID) | ORAL | Status: DC | PRN
  Administered 2017-03-07 – 2017-03-10 (×3): 10 mg via ORAL
  Filled 2017-03-04 (×3): qty 1

## 2017-03-04 NOTE — Progress Notes (Signed)
Pulse ox running below 90% with 02 increased to 6l/[place]. Temp 100.8. Reports left chest wall pain 9/10 with every respiration. ABG done. CXR done. Paged Dr. Amado CoeGouru and she is coming to see pt. Pt in moderate respiratory distress.

## 2017-03-04 NOTE — Progress Notes (Signed)
Updating orders. Awaiting bed assignment for transfer to step down. Reports breathing better with ventimask.

## 2017-03-04 NOTE — Progress Notes (Signed)
RN asked chaplain to visit with pt. whom she said did not have family members around other than some few friends who had visit her. [place] met pt. who was having her dinner at the time. Pt told [place] that she is from [place] and that she was a business trip when she got sick and hospitalized. Pt was pleasantly grateful for her doctor and the care offered to her. Pt said she is missing her parents and her dog. Pt requested for prayers, which [place] provided with a ministry of presence. [place] will follow up pt. as needed.   03/04/17 2000  Clinical Encounter Type  Visited With Patient  Visit Type Initial;Spiritual support;Other (Comment)  Referral From Nurse  Consult/Referral To Chaplain  Spiritual Encounters  Spiritual Needs Prayer;Other (Comment)

## 2017-03-04 NOTE — Progress Notes (Signed)
Patient noted to have increased shortness of breath this morning and increased pain.  Oxygen had to be increased to 6lpm  and patient was still only saturating 90%.  Dr Amado CoeGouru paged.  ABG and chest X-ray obtained.  Patient placed on 55% VM after ABG results seen.

## 2017-03-04 NOTE — Progress Notes (Signed)
[facility] [place] at [facility]   PATIENT NAME: Erin HemanDeborah Russell    MR#:  [id number]  DATE OF BIRTH:  02/05/1956  SUBJECTIVE:  CHIEF COMPLAINT:  Patient is still reporting chest pain with deep inspirations. Significantly short of breath and hypoxic today requiring nonrebreather. Patient is from [place]  REVIEW OF SYSTEMS:  CONSTITUTIONAL: No fever, fatigue or weakness.  EYES: No blurred or double vision.  EARS, NOSE, AND THROAT: No tinnitus or ear pain.  RESPIRATORY: No cough, reporting exertional shortness of breath, denies wheezing or hemoptysis.  CARDIOVASCULAR: Patient is reporting chest pain with deep inspirations , denies orthopnea, edema.  GASTROINTESTINAL: No nausea, vomiting, diarrhea or abdominal pain.  GENITOURINARY: No dysuria, hematuria.  ENDOCRINE: No polyuria, nocturia,  HEMATOLOGY: No anemia, easy bruising or bleeding SKIN: No rash or lesion. MUSCULOSKELETAL: No joint pain or arthritis.   NEUROLOGIC: No tingling, numbness, weakness.  PSYCHIATRY: No anxiety or depression.   DRUG ALLERGIES:   Allergies  Allergen Reactions  . Codeine   . Latex Hives and Swelling    VITALS:  Blood pressure (!) 160/62, pulse 97, temperature 98.3 F (36.8 C), temperature source Oral, resp. rate (!) 22, height 5\' 5"  (1.651 m), weight 109.1 kg (240 lb 8 oz), SpO2 90 %.  PHYSICAL EXAMINATION:  GENERAL:  61 y.o.-year-old patient lying in the bed with no acute distress.  EYES: Pupils equal, round, reactive to light and accommodation. No scleral icterus. Extraocular muscles intact.  HEENT: Head atraumatic, normocephalic. Oropharynx and nasopharynx clear.  NECK:  Supple, no jugular venous distention. No thyroid enlargement, no tenderness.  LUNGS: Moderate breath sounds on right side and diminished breath sounds on left side, no wheezing, rales,rhonchi or crepitation. No use of accessory muscles of respiration.  CARDIOVASCULAR: S1, S2 normal. No murmurs, rubs,  or gallops.  ABDOMEN: Soft, nontender, nondistended. Bowel sounds present. No organomegaly or mass.  EXTREMITIES: No pedal edema, cyanosis, or clubbing.  NEUROLOGIC: Cranial nerves II through XII are intact. Muscle strength 5/5 in all extremities. Sensation intact. Gait not checked.  PSYCHIATRIC: The patient is alert and oriented x 3.  SKIN: No obvious rash, lesion, or ulcer.    LABORATORY PANEL:   CBC Recent Labs  Lab 03/04/17 0653  WBC 18.3*  HGB 12.0  HCT 37.4  PLT 210   ------------------------------------------------------------------------------------------------------------------  Chemistries  Recent Labs  Lab 03/04/17 0653  NA 136  K 4.2  CL 105  CO2 24  GLUCOSE 157*  BUN 21*  CREATININE 0.91  CALCIUM 8.4*   ------------------------------------------------------------------------------------------------------------------  Cardiac Enzymes Recent Labs  Lab 03/02/17 2028  TROPONINI <0.03   ------------------------------------------------------------------------------------------------------------------  RADIOLOGY:  Dg Chest 2 View  Result Date: 03/02/2017 CLINICAL DATA:  Left-sided chest pain EXAM: CHEST  2 VIEW COMPARISON:  None. FINDINGS: Cardiac shadow is within normal limits. The lungs are well aerated bilaterally. Mild left basilar infiltrate is seen. No focal bony abnormality is noted. IMPRESSION: Mild left basilar infiltrate. Electronically Signed   By: Alcide CleverMark  Lukens M.D.   On: 03/02/2017 20:51   Ct Angio Chest Pe W And/or Wo Contrast  Result Date: 03/02/2017 CLINICAL DATA:  61 y/o F; left-sided flank and lower chest pain. PE suspected. EXAM: CT ANGIOGRAPHY CHEST WITH CONTRAST TECHNIQUE: Multidetector CT imaging of the chest was performed using the standard protocol during bolus administration of intravenous contrast. Multiplanar CT image reconstructions and MIPs were obtained to evaluate the vascular anatomy. CONTRAST:  75mL ISOVUE-370 IOPAMIDOL  (ISOVUE-370) INJECTION 76% COMPARISON:  03/02/2017 chest radiograph FINDINGS:  Cardiovascular: Normal caliber thoracic aorta with mild calcific atherosclerosis. Main pulmonary artery measures 3.5 cm. Mild coronary artery calcification. Normal heart size. No pericardial effusion. RV/ LV equals 0.55. Satisfactory opacification of pulmonary arteries, mild respiratory motion artifact, no pulmonary embolus identified. Mediastinum/Nodes: No enlarged mediastinal, hilar, or axillary lymph nodes. Thyroid gland, trachea, and esophagus demonstrate no significant findings. Lungs/Pleura: Small left lower lobe basal lateral and basal posterior consolidation with small effusion probably representing pneumonia. Platelike atelectasis in the right lung base. Upper Abdomen: Bilateral adrenal nodules measuring less than 10 HU, 24 mm on the left and 26 mm on the right, compatible with adenoma. Musculoskeletal: No chest wall abnormality. No acute or significant osseous findings. Review of the MIP images confirms the above findings. IMPRESSION: 1. Mild respiratory motion artifact. No pulmonary embolus identified. 2. Small left lower lobe basilar consolidation and small left effusion likely representing pneumonia. 3. Bilateral adrenal adenomas. 4. Mild aortic and coronary artery calcification. 5. Enlarged main pulmonary artery may represent pulmonary artery hypertension. Electronically Signed   By: Mitzi Hansen M.D.   On: 03/02/2017 22:34   Dg Chest Port 1 View  Result Date: 03/04/2017 CLINICAL DATA:  61 year old female with chest pain and shortness of breath EXAM: PORTABLE CHEST 1 VIEW COMPARISON:  Recent prior chest x-ray and CT scan of the chest 03/03/2007 FINDINGS: Interval development of a moderately large left-sided pleural effusion with associated left lower lobe atelectasis. Patchy airspace opacity is now present in the right lower lobe. The pleural effusion has convex margins raising the possibility of loculation.  Cardiac and mediastinal contours remain unchanged. Atherosclerotic calcifications are present in the transverse aorta. No evidence pneumothorax. No acute osseous abnormality. IMPRESSION: 1. Interval development of a moderately large left-sided pleural effusion with convex margins raising the possibility of internal complexity/loculation. Differential considerations include a parapneumonic effusion and empyema. Recommend further evaluation with CT scan of the chest with intravenous contrast. 2. New patchy airspace opacity in the right lung base concerning for developing multifocal pneumonia. 3.  Aortic Atherosclerosis (ICD10-170.0) Electronically Signed   By: Malachy Moan M.D.   On: 03/04/2017 08:46   Ct Renal Stone Study  Result Date: 03/02/2017 CLINICAL DATA:  LEFT flank pain. Admission for radiation. Suspect kidney disease. EXAM: CT ABDOMEN AND PELVIS WITHOUT CONTRAST TECHNIQUE: Multidetector CT imaging of the abdomen and pelvis was performed following the standard protocol without IV contrast. COMPARISON:  None. FINDINGS: LOWER CHEST: Small LEFT pleural effusion and consolidation. Included heart size is normal. No pericardial effusion. Mild coronary artery calcifications. HEPATOBILIARY: Status post cholecystectomy. Borderline hepatomegaly. PANCREAS: Normal. SPLEEN: Normal. ADRENALS/URINARY TRACT: Kidneys are orthotopic, demonstrating normal size and morphology. Punctate LEFT upper pole nephrolithiasis. 2.1 cm benign-appearing RIGHT interpolar cyst. 17 mm RIGHT upper pole cyst. No hydronephrosis; limited assessment for renal masses on this nonenhanced examination. The unopacified ureters are normal in course and caliber. Urinary bladder is well distended and unremarkable. Bilateral adrenal nodules, less than 10 Hounsfield units consistent with benign adenomas measuring 2.5 cm on the RIGHT, 2.2 cm on the LEFT. STOMACH/BOWEL: The stomach, small and large bowel are normal in course and caliber without  inflammatory changes, sensitivity decreased by lack of enteric contrast. Severe descending and sigmoid colonic diverticulosis. Mild remaining colonic diverticulosis. Normal appendix. VASCULAR/LYMPHATIC: Aortoiliac vessels are normal in course and caliber. Moderate calcific atherosclerosis. No lymphadenopathy by CT size criteria. REPRODUCTIVE: Normal. OTHER: No intraperitoneal free fluid or free air. MUSCULOSKELETAL: Non-acute. Congenital canal narrowing. Minimal grade 1 L4-5 anterolisthesis. Severe lower lumbar facet arthropathy. Moderate  canal stenosis L3-4. IMPRESSION: 1. Punctate nonobstructing LEFT nephrolithiasis. 2. Severe descending and sigmoid colonic diverticulosis without acute diverticulitis nor acute intra-abdominal/pelvic process. 3. Small LEFT pleural effusion and LEFT lower lobe consolidation, please see CT of chest from same day, reported separately. Aortic Atherosclerosis (ICD10-I70.0). Electronically Signed   By: Awilda Metroourtnay  Bloomer M.D.   On: 03/02/2017 22:37    EKG:   Orders placed or performed during the hospital encounter of 03/02/17  . ED EKG within 10 minutes  . ED EKG within 10 minutes  . EKG 12-Lead  . EKG 12-Lead    ASSESSMENT AND PLAN:     * Ac respi failure with hypoxia secondary to pneumonia with moderately large left-sided pleural effusion Clinically hypoxia getting worse requiring nonrebreather Transferring patient to stepdown unit and discussed with Dr. Lonn Georgiaonforti in intensivist IS      *Pneumonia with pleuritic chest pain   IV rocephin+ Azithromycin changed to Zosyn and vancomycin    Bl cx negative and urine culture is contaminated     continue bronchodilator therapy with nebulizer treatments  nonrebreather. Solu-Medrol added to the regimen   * Hx of smoking But quit smoking 3 years ago   Denies COPD, will give duoneb now, try to taper oxygen.  * DM   ISs. Hold oral meds  Lantus 20 units Check hemoglobin A1c  * Htn    Cont home meds, hold  diuretics  * Hyperlipidemia    Cont statin.  * Hx of CAD    Cont home meds bisoprolol, Cozaar, statin Continue aspirin     All the records are reviewed and case discussed with Care Management/Social Workerr. Management plans discussed with the patient, she prefers calling her friend and family members.  CODE STATUS: FC, healthcare power of attorney friend [phone]  TOTAL CRITICAL CARE TIME TAKING CARE OF THIS PATIENT: 36  minutes.   POSSIBLE D/C IN 1-2  DAYS, DEPENDING ON CLINICAL CONDITION.  Note: This dictation was prepared with Dragon dictation along with smaller phrase technology. Any transcriptional errors that result from this process are unintentional.   Ramonita LabGouru, Meko Bellanger M.D on 03/04/2017 at 1:26 PM  Between 7am to 6pm - Pager - [phone] After 6pm go to [email or web address] - password [email or web address]  [facility]  Office  [phone]  CC: Primary care physician; System, Pcp Not In

## 2017-03-04 NOTE — Progress Notes (Signed)
CXR image noted with white out appearance of LLL. Paged Dr. Amado CoeGouru to evaluate CXR image. Pt requested percocet for pain with pt reporting it works better. Respiratory to change to ventimask with pt agreeable.

## 2017-03-04 NOTE — Progress Notes (Signed)
Pharmacy Antibiotic Note  Erin Russell is a 61 y.o. female admitted on 03/02/2017 with sepsis.  Pharmacy has been consulted for vancomycin and zosyn dosing.  Plan: Vancomycin 1250mg  IV every 12 hours.  Goal trough 15-20 mcg/mL. Zosyn 3.375g IV q8h (4 hour infusion). Vancomycin trough 12/10@0130  before 4th dose   Height: 5\' 5"  (165.1 cm) Weight: 240 lb 8 oz (109.1 kg) IBW/kg (Calculated) : 57  Temp (24hrs), Avg:99.6 F (37.6 C), Min:98.4 F (36.9 C), Max:100.8 F (38.2 C)  Recent Labs  Lab 03/02/17 2028 03/02/17 2306 03/03/17 0219 03/03/17 0607 03/04/17 0653  WBC 16.2*  --   --  15.4* 18.3*  CREATININE 0.95  --   --   --  0.91  LATICACIDVEN  --  1.4 1.6  --   --     Estimated Creatinine Clearance: 79.7 mL/min (by C-G formula based on SCr of 0.91 mg/dL).    Allergies  Allergen Reactions  . Codeine   . Latex Hives and Swelling    Antimicrobials this admission: Anti-infectives (From admission, onward)   Start     Dose/Rate Route Frequency Ordered Stop   03/04/17 1400  vancomycin (VANCOCIN) 1,250 mg in sodium chloride 0.9 % 250 mL IVPB     1,250 mg 166.7 mL/hr over 90 Minutes Intravenous Every 12 hours 03/04/17 1143     03/04/17 1000  cefTRIAXone (ROCEPHIN) 1 g in dextrose 5 % 50 mL injection  Status:  Discontinued      Intravenous Every 24 hours 03/03/17 1644 03/04/17 0837   03/04/17 1000  piperacillin-tazobactam (ZOSYN) IVPB 3.375 g     3.375 g 12.5 mL/hr over 240 Minutes Intravenous Every 8 hours 03/04/17 0954     03/04/17 1000  vancomycin (VANCOCIN) IVPB 1000 mg/200 mL premix     1,000 mg 200 mL/hr over 60 Minutes Intravenous  Once 03/04/17 0954     03/03/17 1000  cefTRIAXone (ROCEPHIN) 1 g in dextrose 5 % 50 mL IVPB - Premix  Status:  Discontinued     1 g 100 mL/hr over 30 Minutes Intravenous Every 24 hours 03/03/17 0006 03/03/17 1643   03/03/17 1000  azithromycin (ZITHROMAX) 500 mg in dextrose 5 % 250 mL IVPB  Status:  Discontinued     500 mg 250 mL/hr over  60 Minutes Intravenous Every 24 hours 03/03/17 0040 03/04/17 0837   03/03/17 0015  azithromycin (ZITHROMAX) 250 mg in dextrose 5 % 125 mL IVPB  Status:  Discontinued     250 mg 125 mL/hr over 60 Minutes Intravenous Every 24 hours 03/03/17 0006 03/03/17 0040   03/02/17 2300  cefTRIAXone (ROCEPHIN) 1 g in dextrose 5 % 50 mL IVPB - Premix     1 g 100 mL/hr over 30 Minutes Intravenous  Once 03/02/17 2250 03/03/17 0001   03/02/17 2300  azithromycin (ZITHROMAX) 500 mg in dextrose 5 % 250 mL IVPB     500 mg 250 mL/hr over 60 Minutes Intravenous  Once 03/02/17 2250 03/03/17 0026      Microbiology results: Recent Results (from the past 240 hour(s))  Blood culture (routine x 2)     Status: None (Preliminary result)   Collection Time: 03/02/17 11:06 PM  Result Value Ref Range Status   Specimen Description BLOOD LT FOREARM  Final   Special Requests   Final    BOTTLES DRAWN AEROBIC AND ANAEROBIC Blood Culture results may not be optimal due to an excessive volume of blood received in culture bottles   Culture NO GROWTH 2  DAYS  Final   Report Status PENDING  Incomplete  Blood culture (routine x 2)     Status: None (Preliminary result)   Collection Time: 03/02/17 11:06 PM  Result Value Ref Range Status   Specimen Description BLOOD RT HAND  Final   Special Requests   Final    BOTTLES DRAWN AEROBIC AND ANAEROBIC Blood Culture adequate volume   Culture NO GROWTH 2 DAYS  Final   Report Status PENDING  Incomplete  Urine Culture     Status: Abnormal   Collection Time: 03/02/17 11:53 PM  Result Value Ref Range Status   Specimen Description URINE, RANDOM  Final   Special Requests Normal  Final   Culture MULTIPLE SPECIES PRESENT, SUGGEST RECOLLECTION (A)  Final   Report Status 03/04/2017 FINAL  Final     Thank you for allowing pharmacy to be a part of this patient's care.  Gerre PebblesGarrett Lindzee Gouge 03/04/2017 11:45 AM

## 2017-03-04 NOTE — Consult Note (Signed)
[facility] Critical Care Medicine Consultation    ASSESSMENT/PLAN   Patient with worsening respiratory distress requiring being moved to the intensive care unit. Chest x-ray and CT scans reviewed. Worsening pleural-parenchymal markings on the left side. Some of it appears to be loculated. Will perform imaging studies, may need image guided thoracentesis. Agree with Zosyn and vancomycin for empiric coverage. Has been started on Solu-Medrol in addition along with as needed bronchodilators.  Name: Erin Russell MRN: [id number] DOB: 10-14-1955    ADMISSION DATE:  03/02/2017 CONSULTATION DATE:  03/04/2017  REFERRING MD :  Amado Coe  CHIEF COMPLAINT:  Chest pain and shortness of breath   HISTORY OF PRESENT ILLNESS:  Erin Russell is a very pleasant 61 year old Caucasian female with past medical history remarkable for hypertension, hyperlipidemia, diabetes mellitus, coronary artery disease, status post cardiac catheterization and stent placement, arthritis, is presently on a trip from [place] where she developed chest discomfort on the left. This progressed along with shortness of breath. She also developed cough with some sputum production although she did not look at it. She denies any fever, chills, night sweats. She was admitted to the hospital and treated for pneumonia, this morning developed progressive increasing shortness of breath requiring higher FiO2 and subsequent transfer to the intensive care unit for further evaluation and management. At the present time she appears comfortable, is awake, alert oriented and communicating. She is in mild respiratory distress.   PAST MEDICAL HISTORY :  Past Medical History:  Diagnosis Date  . Arthritis   . Diabetes mellitus without complication (HCC)   . Heart disease   . Hypertension   . MI (myocardial infarction) [facility])    Past Surgical History:  Procedure Laterality Date  . CARDIAC CATHETERIZATION    . CHOLECYSTECTOMY     Prior to Admission  medications   Medication Sig Start Date End Date Taking? Authorizing Provider  amLODipine (NORVASC) 10 MG tablet Take 1 tablet by mouth daily. In the morning 12/27/16  Yes [provider]  aspirin EC 81 MG tablet Take 81 mg by mouth daily.   Yes [provider]  bisoprolol (ZEBETA) 10 MG tablet Take 10 mg by mouth daily. In the afternoon 01/06/17  Yes [provider]  cholecalciferol (VITAMIN D) 1000 units tablet Take 2,000 Units by mouth daily. In the morning   Yes [provider]  FARXIGA 10 MG TABS tablet Take 10 mg by mouth daily. In the morning 01/29/17  Yes [provider]  hydrochlorothiazide (HYDRODIURIL) 12.5 MG tablet Take 1 tablet by mouth daily. In the afternoon 12/01/16  Yes [provider]  losartan (COZAAR) 100 MG tablet Take 100 mg by mouth daily. In the evening 02/07/17  Yes [provider]  meloxicam (MOBIC) 15 MG tablet meloxicam 15 mg tablet  TAKE 1 TABLET BY MOUTH EVERY DAY   Yes [provider]  metFORMIN (GLUCOPHAGE) 1000 MG tablet metformin 1,000 mg tablet  TAKE 1 TABLET TWICE A DAY WITH MEALS   Yes [provider]  nitroGLYCERIN (NITROSTAT) 0.4 MG SL tablet nitroglycerin 0.4 mg sublingual tablet  PLACE 1 TABLET UNDER TONGUE ONCE A DAY IF CHEST PAIN RECURS,MAX 1 TABLET IN 24 HOURS PERIOD   Yes [provider]  rosuvastatin (CRESTOR) 20 MG tablet Take 20 mg by mouth daily. In the evening 01/09/17  Yes [provider]  TRULICITY 1.5 MG/0.5ML SOPN Inject 0.5 mLs into the skin once a week. 01/03/17  Yes [provider]   Allergies  Allergen Reactions  .  Codeine   . Latex Hives and Swelling    FAMILY HISTORY:  Family History  Problem Relation Age of Onset  . CAD Mother   . CAD Father    SOCIAL HISTORY:  reports that she has quit smoking. she has never used smokeless tobacco. She reports that she drinks alcohol. She reports that she does not use drugs.  REVIEW OF  SYSTEMS:   Constitutional: Feels well. Cardiovascular: No chest pain.  Pulmonary: Denies dyspnea.   The remainder of systems were reviewed and were found to be negative other than what is documented in the HPI.    VITAL SIGNS: Temp:  [98.4 F (36.9 C)-100.8 F (38.2 C)] 100.8 F (38.2 C) (12/08 0805) Pulse Rate:  [83-97] 97 (12/08 0805) Resp:  [19-22] 22 (12/08 0805) BP: (117-160)/(49-63) 160/62 (12/08 0805) SpO2:  [87 %-92 %] 90 % (12/08 0805) FiO2 (%):  [55 %] 55 % (12/08 0810) HEMODYNAMICS:   VENTILATOR SETTINGS: FiO2 (%):  [55 %] 55 % INTAKE / OUTPUT:  Intake/Output Summary (Last 24 hours) at 03/04/2017 1011 Last data filed at 03/04/2017 0940 Gross per 24 hour  Intake 2343.33 ml  Output 300 ml  Net 2043.33 ml    Physical Examination:   VS: BP (!) 160/62 (BP Location: Left Arm)   Pulse 97   Temp (!) 100.8 F (38.2 C) (Oral)   Resp (!) 22   Ht 5\' 5"  (1.651 m)   Wt 109.1 kg (240 lb 8 oz)   SpO2 90%   BMI 40.02 kg/m   General Appearance: Patient is awake alert, oriented does have some accessory muscle utilization but appears comfortable right now Neuro:without focal findings, mental status, speech normal,. HEENT: PERRLA, EOM intact, no ptosis, no other lesions noticed;  Pulmonary: Inspiratory crackles appreciated left greater than right Cardiovascular underlying sinus mechanism with tachycardia Abdomen: Benign, Soft, non-tender, No masses, hepatosplenomegaly, No lymphadenopathy Skin:   warm, no rashes, no ecchymosis  Extremities: normal, no cyanosis, clubbing, no edema, warm with normal capillary refill.    LABS: Reviewed   LABORATORY PANEL:   CBC Recent Labs  Lab 03/04/17 0653  WBC 18.3*  HGB 12.0  HCT 37.4  PLT 210    Chemistries  Recent Labs  Lab 03/04/17 0653  NA 136  K 4.2  CL 105  CO2 24  GLUCOSE 157*  BUN 21*  CREATININE 0.91  CALCIUM 8.4*    Recent Labs  Lab 03/03/17 0210 03/03/17 0755 03/03/17 1147 03/03/17 1700  03/03/17 2058 03/04/17 0750  GLUCAP 230* 167* 313* 214* 226* 137*   Recent Labs  Lab 03/04/17 0746  PHART 7.40  PCO2ART 37  PO2ART 49*   No results for input(s): AST, ALT, ALKPHOS, BILITOT, ALBUMIN in the last 168 hours.  Cardiac Enzymes Recent Labs  Lab 03/02/17 2028  TROPONINI <0.03    RADIOLOGY:  Dg Chest 2 View  Result Date: 03/02/2017 CLINICAL DATA:  Left-sided chest pain EXAM: CHEST  2 VIEW COMPARISON:  None. FINDINGS: Cardiac shadow is within normal limits. The lungs are well aerated bilaterally. Mild left basilar infiltrate is seen. No focal bony abnormality is noted. IMPRESSION: Mild left basilar infiltrate. Electronically Signed   By: Alcide CleverMark  Lukens M.D.   On: 03/02/2017 20:51   Ct Angio Chest Pe W And/or Wo Contrast  Result Date: 03/02/2017 CLINICAL DATA:  61 y/o F; left-sided flank and lower chest pain. PE suspected. EXAM: CT ANGIOGRAPHY CHEST WITH CONTRAST TECHNIQUE: Multidetector CT imaging of the chest was performed using the standard  protocol during bolus administration of intravenous contrast. Multiplanar CT image reconstructions and MIPs were obtained to evaluate the vascular anatomy. CONTRAST:  75mL ISOVUE-370 IOPAMIDOL (ISOVUE-370) INJECTION 76% COMPARISON:  03/02/2017 chest radiograph FINDINGS: Cardiovascular: Normal caliber thoracic aorta with mild calcific atherosclerosis. Main pulmonary artery measures 3.5 cm. Mild coronary artery calcification. Normal heart size. No pericardial effusion. RV/ LV equals 0.55. Satisfactory opacification of pulmonary arteries, mild respiratory motion artifact, no pulmonary embolus identified. Mediastinum/Nodes: No enlarged mediastinal, hilar, or axillary lymph nodes. Thyroid gland, trachea, and esophagus demonstrate no significant findings. Lungs/Pleura: Small left lower lobe basal lateral and basal posterior consolidation with small effusion probably representing pneumonia. Platelike atelectasis in the right lung base. Upper Abdomen:  Bilateral adrenal nodules measuring less than 10 HU, 24 mm on the left and 26 mm on the right, compatible with adenoma. Musculoskeletal: No chest wall abnormality. No acute or significant osseous findings. Review of the MIP images confirms the above findings. IMPRESSION: 1. Mild respiratory motion artifact. No pulmonary embolus identified. 2. Small left lower lobe basilar consolidation and small left effusion likely representing pneumonia. 3. Bilateral adrenal adenomas. 4. Mild aortic and coronary artery calcification. 5. Enlarged main pulmonary artery may represent pulmonary artery hypertension. Electronically Signed   By: Mitzi Hansen M.D.   On: 03/02/2017 22:34   Dg Chest Port 1 View  Result Date: 03/04/2017 CLINICAL DATA:  61 year old female with chest pain and shortness of breath EXAM: PORTABLE CHEST 1 VIEW COMPARISON:  Recent prior chest x-ray and CT scan of the chest 03/03/2007 FINDINGS: Interval development of a moderately large left-sided pleural effusion with associated left lower lobe atelectasis. Patchy airspace opacity is now present in the right lower lobe. The pleural effusion has convex margins raising the possibility of loculation. Cardiac and mediastinal contours remain unchanged. Atherosclerotic calcifications are present in the transverse aorta. No evidence pneumothorax. No acute osseous abnormality. IMPRESSION: 1. Interval development of a moderately large left-sided pleural effusion with convex margins raising the possibility of internal complexity/loculation. Differential considerations include a parapneumonic effusion and empyema. Recommend further evaluation with CT scan of the chest with intravenous contrast. 2. New patchy airspace opacity in the right lung base concerning for developing multifocal pneumonia. 3.  Aortic Atherosclerosis (ICD10-170.0) Electronically Signed   By: Malachy Moan M.D.   On: 03/04/2017 08:46   Ct Renal Stone Study  Result Date:  03/02/2017 CLINICAL DATA:  LEFT flank pain. Admission for radiation. Suspect kidney disease. EXAM: CT ABDOMEN AND PELVIS WITHOUT CONTRAST TECHNIQUE: Multidetector CT imaging of the abdomen and pelvis was performed following the standard protocol without IV contrast. COMPARISON:  None. FINDINGS: LOWER CHEST: Small LEFT pleural effusion and consolidation. Included heart size is normal. No pericardial effusion. Mild coronary artery calcifications. HEPATOBILIARY: Status post cholecystectomy. Borderline hepatomegaly. PANCREAS: Normal. SPLEEN: Normal. ADRENALS/URINARY TRACT: Kidneys are orthotopic, demonstrating normal size and morphology. Punctate LEFT upper pole nephrolithiasis. 2.1 cm benign-appearing RIGHT interpolar cyst. 17 mm RIGHT upper pole cyst. No hydronephrosis; limited assessment for renal masses on this nonenhanced examination. The unopacified ureters are normal in course and caliber. Urinary bladder is well distended and unremarkable. Bilateral adrenal nodules, less than 10 Hounsfield units consistent with benign adenomas measuring 2.5 cm on the RIGHT, 2.2 cm on the LEFT. STOMACH/BOWEL: The stomach, small and large bowel are normal in course and caliber without inflammatory changes, sensitivity decreased by lack of enteric contrast. Severe descending and sigmoid colonic diverticulosis. Mild remaining colonic diverticulosis. Normal appendix. VASCULAR/LYMPHATIC: Aortoiliac vessels are normal in course and caliber. Moderate  calcific atherosclerosis. No lymphadenopathy by CT size criteria. REPRODUCTIVE: Normal. OTHER: No intraperitoneal free fluid or free air. MUSCULOSKELETAL: Non-acute. Congenital canal narrowing. Minimal grade 1 L4-5 anterolisthesis. Severe lower lumbar facet arthropathy. Moderate canal stenosis L3-4. IMPRESSION: 1. Punctate nonobstructing LEFT nephrolithiasis. 2. Severe descending and sigmoid colonic diverticulosis without acute diverticulitis nor acute intra-abdominal/pelvic process. 3.  Small LEFT pleural effusion and LEFT lower lobe consolidation, please see CT of chest from same day, reported separately. Aortic Atherosclerosis (ICD10-I70.0). Electronically Signed   By: Awilda Metroourtnay  Bloomer M.D.   On: 03/02/2017 22:37      Tora KindredJohn Geovanny Sartin, DO 03/04/2017, 10:11 AM

## 2017-03-04 NOTE — Progress Notes (Signed)
[facility] Pt transported in bed to step down bed 12. Respirations easier/even.

## 2017-03-04 NOTE — Progress Notes (Signed)
Pulmonary/critical care  Bedside ultrasound reveals complex collection. Difficult images secondary to body habitus. We'll image further with CT scan of the chest. Since she recently had a contrast study will perform noncontrast  Tora KindredJohn Brixton Franko, D.O.

## 2017-03-05 ENCOUNTER — Inpatient Hospital Stay: Payer: BLUE CROSS/BLUE SHIELD

## 2017-03-05 LAB — GLUCOSE, CAPILLARY
GLUCOSE-CAPILLARY: 133 mg/dL — AB (ref 65–99)
GLUCOSE-CAPILLARY: 151 mg/dL — AB (ref 65–99)
GLUCOSE-CAPILLARY: 161 mg/dL — AB (ref 65–99)
GLUCOSE-CAPILLARY: 172 mg/dL — AB (ref 65–99)
GLUCOSE-CAPILLARY: 178 mg/dL — AB (ref 65–99)
GLUCOSE-CAPILLARY: 181 mg/dL — AB (ref 65–99)
GLUCOSE-CAPILLARY: 202 mg/dL — AB (ref 65–99)
GLUCOSE-CAPILLARY: 221 mg/dL — AB (ref 65–99)
GLUCOSE-CAPILLARY: 222 mg/dL — AB (ref 65–99)
GLUCOSE-CAPILLARY: 249 mg/dL — AB (ref 65–99)
GLUCOSE-CAPILLARY: 252 mg/dL — AB (ref 65–99)
GLUCOSE-CAPILLARY: 282 mg/dL — AB (ref 65–99)
Glucose-Capillary: 210 mg/dL — ABNORMAL HIGH (ref 65–99)
Glucose-Capillary: 136 mg/dL — ABNORMAL HIGH (ref 65–99)
Glucose-Capillary: 225 mg/dL — ABNORMAL HIGH (ref 65–99)
Glucose-Capillary: 138 mg/dL — ABNORMAL HIGH (ref 65–99)
Glucose-Capillary: 143 mg/dL — ABNORMAL HIGH (ref 65–99)
Glucose-Capillary: 147 mg/dL — ABNORMAL HIGH (ref 65–99)
Glucose-Capillary: 217 mg/dL — ABNORMAL HIGH (ref 65–99)
Glucose-Capillary: 159 mg/dL — ABNORMAL HIGH (ref 65–99)
Glucose-Capillary: 138 mg/dL — ABNORMAL HIGH (ref 65–99)

## 2017-03-05 LAB — CBC WITH DIFFERENTIAL/PLATELET
BASOS ABS: 0 10*3/uL (ref 0–0.1)
BASOS PCT: 0 %
EOS ABS: 0 10*3/uL (ref 0–0.7)
EOS PCT: 0 %
HCT: 36.3 % (ref 35.0–47.0)
Hemoglobin: 11.7 g/dL — ABNORMAL LOW (ref 12.0–16.0)
Lymphocytes Relative: 4 %
Lymphs Abs: 0.8 10*3/uL — ABNORMAL LOW (ref 1.0–3.6)
MCH: 28.1 pg (ref 26.0–34.0)
MCHC: 32.4 g/dL (ref 32.0–36.0)
MCV: 86.9 fL (ref 80.0–100.0)
MONO ABS: 0.8 10*3/uL (ref 0.2–0.9)
Monocytes Relative: 4 %
Neutro Abs: 18.4 10*3/uL — ABNORMAL HIGH (ref 1.4–6.5)
Neutrophils Relative %: 92 %
PLATELETS: 253 10*3/uL (ref 150–440)
RBC: 4.17 MIL/uL (ref 3.80–5.20)
RDW: 16.1 % — AB (ref 11.5–14.5)
WBC: 20.1 10*3/uL — AB (ref 3.6–11.0)

## 2017-03-05 LAB — HEMOGLOBIN A1C
HEMOGLOBIN A1C: 7.1 % — AB (ref 4.8–5.6)
Mean Plasma Glucose: 157.07 mg/dL

## 2017-03-05 LAB — EXPECTORATED SPUTUM ASSESSMENT W REFEX TO RESP CULTURE: SPECIAL REQUESTS: NORMAL

## 2017-03-05 LAB — EXPECTORATED SPUTUM ASSESSMENT W GRAM STAIN, RFLX TO RESP C

## 2017-03-05 MED ORDER — METOPROLOL TARTRATE 5 MG/5ML IV SOLN
INTRAVENOUS | Status: AC
  Administered 2017-03-05: 5 mg
  Filled 2017-03-05: qty 5

## 2017-03-05 MED ORDER — LORAZEPAM 2 MG/ML IJ SOLN
INTRAMUSCULAR | Status: AC
  Administered 2017-03-05: 1 mg
  Filled 2017-03-05: qty 1

## 2017-03-05 MED ORDER — ONDANSETRON HCL 4 MG/2ML IJ SOLN: 4.0000 mg | Freq: Four times a day (QID) | INTRAMUSCULAR | Status: DC | PRN

## 2017-03-05 MED ORDER — FENTANYL CITRATE (PF) 100 MCG/2ML IJ SOLN
INTRAMUSCULAR | Status: AC
  Administered 2017-03-05: 25 ug
  Filled 2017-03-05: qty 2

## 2017-03-05 NOTE — Progress Notes (Signed)
CMDD called alerting to HR up to 118's and looking like pt converted to afib. 12 lead ordered per MD.  Pt w/o c/o's. Denies sob, cp, etcLurene Shadow.  BAllen, RN

## 2017-03-05 NOTE — Progress Notes (Signed)
[facility] Critical Care Medicine Progess Note    SYNOPSIS   61 year old Caucasian female with past medical history remarkable for hypertension, hyperlipidemia, diabetes mellitus, coronary artery disease, status post cardiac catheterization and stent placement, arthritis, with pneumonia, complicated pleural space with some evidence loculation.  ASSESSMENT/PLAN   Complicated pleural space, pneumonia with loculation concerning for empyema. Unable to get good acoustic windows at the bedside. Chest CT shows 2 areas of loculation. Pending IR guided drainage, continue Zosyn and vancomycin, may need CT surgery consultation for possible decortication if does not improve. Will repeat chest x-ray today  Name: Erin Russell MRN: [id number] DOB: 11/03/1955    ADMISSION DATE:  03/02/2017  SUBJECTIVE:   Patient states she is breathing better, her shortness of breath and pleuritic chest pain has improved some and status post CT scan of the chest   VITAL SIGNS: Temp:  [98.2 F (36.8 C)-100 F (37.8 C)] 98.8 F (37.1 C) (12/09 0400) Pulse Rate:  [73-107] 83 (12/09 0600) Resp:  [17-24] 17 (12/09 0600) BP: (93-123)/(52-75) 93/71 (12/09 0600) SpO2:  [90 %-94 %] 92 % (12/09 0734) FiO2 (%):  [55 %-94 %] 55 % (12/09 0734)  PHYSICAL EXAMINATION: Physical Examination:   VS: BP 93/71   Pulse 83   Temp 98.8 F (37.1 C) (Oral)   Resp 17   Ht 5\' 5"  (1.651 m)   Wt 109.1 kg (240 lb 8 oz)   SpO2 92%   BMI 40.02 kg/m   General Appearance: No distress  Neuro:without focal findings, mental status normal. HEENT: PERRLA, EOM intact. Pulmonary: Inspiratory crackles appreciated in the left posterior lung zone Cardiovascular sinus mechanism on the monitor Abdomen: Benign, Soft, non-tender. Endocrine: No evident thyromegaly. Skin:   warm, no rashes, no ecchymosis  Extremities: normal, no cyanosis, clubbing.   LABORATORY PANEL:   CBC Recent Labs  Lab 03/05/17 0432  WBC 20.1*  HGB 11.7*  HCT  36.3  PLT 253    Chemistries  Recent Labs  Lab 03/04/17 0653  NA 136  K 4.2  CL 105  CO2 24  GLUCOSE 157*  BUN 21*  CREATININE 0.91  CALCIUM 8.4*    Recent Labs  Lab 03/05/17 0313 03/05/17 0402 03/05/17 0525 03/05/17 0627 03/05/17 0727 03/05/17 0833  GLUCAP 210* 202* 172* 161* 133* 136*   Recent Labs  Lab 03/04/17 0746  PHART 7.40  PCO2ART 37  PO2ART 49*   No results for input(s): AST, ALT, ALKPHOS, BILITOT, ALBUMIN in the last 168 hours.  Cardiac Enzymes Recent Labs  Lab 03/02/17 2028  TROPONINI <0.03    RADIOLOGY:  Ct Chest Wo Contrast  Result Date: 03/04/2017 CLINICAL DATA:  Worsening of pneumonia. EXAM: CT CHEST WITHOUT CONTRAST TECHNIQUE: Multidetector CT imaging of the chest was performed following the standard protocol without IV contrast. COMPARISON:  Radiography same day.  CT 03/02/2017 FINDINGS: Cardiovascular: Aortic atherosclerosis. Coronary artery calcification and stents. Mediastinum/Nodes: No mass or lymphadenopathy. Lungs/Pleura: Marked worsening. On the right, the patient has developed consolidation and volume loss in the right lower lobe and to a lesser extent in the right middle lobe. No pleural fluid on the right. On the left, the patient has developed consolidation throughout the left lower lobe and a large amount of pleural fluid which is partially loculated consistent with empyema. Upper Abdomen: Negative Musculoskeletal: Unremarkable IMPRESSION: Marked worsening. Complete consolidation and collapse of the left lower lobe. Large amount of partially loculated pleural fluid on the left consistent with empyema. New infiltrate and volume loss in the  right lower lobe and to a lesser extent the right middle lobe. Electronically Signed   By: Paulina FusiMark  Shogry M.D.   On: 03/04/2017 13:47   Dg Chest Port 1 View  Result Date: 03/04/2017 CLINICAL DATA:  61 year old female with chest pain and shortness of breath EXAM: PORTABLE CHEST 1 VIEW COMPARISON:  Recent  prior chest x-ray and CT scan of the chest 03/03/2007 FINDINGS: Interval development of a moderately large left-sided pleural effusion with associated left lower lobe atelectasis. Patchy airspace opacity is now present in the right lower lobe. The pleural effusion has convex margins raising the possibility of loculation. Cardiac and mediastinal contours remain unchanged. Atherosclerotic calcifications are present in the transverse aorta. No evidence pneumothorax. No acute osseous abnormality. IMPRESSION: 1. Interval development of a moderately large left-sided pleural effusion with convex margins raising the possibility of internal complexity/loculation. Differential considerations include a parapneumonic effusion and empyema. Recommend further evaluation with CT scan of the chest with intravenous contrast. 2. New patchy airspace opacity in the right lung base concerning for developing multifocal pneumonia. 3.  Aortic Atherosclerosis (ICD10-170.0) Electronically Signed   By: Malachy MoanHeath  McCullough M.D.   On: 03/04/2017 08:46   Tora KindredJohn Imaad Reuss, DO 03/05/2017

## 2017-03-05 NOTE — Progress Notes (Signed)
Pulmonary/critical care  I spent close to an hour with ultrasound trying to get a good acoustic window to perform pigtail catheter. Was unable. Discussed with interventional radiology. Because of the snowstorm they are unable to get in today, will place pigtail catheter under CT guidance tomorrow morning.  Tora KindredJohn Zayden Maffei, D.O.

## 2017-03-05 NOTE — Progress Notes (Signed)
[facility] [place] at [facility]   PATIENT NAME: Erin HemanDeborah Russell    MR#:  [id number]  DATE OF BIRTH:  10/14/1955  SUBJECTIVE:  CHIEF COMPLAINT:  Patient is still reporting chest pain with deep inspirations. Was significantly short of breath and hypoxic ,requiring nonrebreather so transferred to step down 03/04/17. Changed to Vanc + zosyn. Showed complicated parapneumonic effusion, feels little better today.  REVIEW OF SYSTEMS:  CONSTITUTIONAL: No fever, fatigue or weakness.  EYES: No blurred or double vision.  EARS, NOSE, AND THROAT: No tinnitus or ear pain.  RESPIRATORY: No cough, reporting exertional shortness of breath, denies wheezing or hemoptysis.  CARDIOVASCULAR: Patient is reporting chest pain with deep inspirations , denies orthopnea, edema.  GASTROINTESTINAL: No nausea, vomiting, diarrhea or abdominal pain.  GENITOURINARY: No dysuria, hematuria.  ENDOCRINE: No polyuria, nocturia,  HEMATOLOGY: No anemia, easy bruising or bleeding SKIN: No rash or lesion. MUSCULOSKELETAL: No joint pain or arthritis.   NEUROLOGIC: No tingling, numbness, weakness.  PSYCHIATRY: No anxiety or depression.   DRUG ALLERGIES:   Allergies  Allergen Reactions  . Codeine   . Latex Hives and Swelling    VITALS:  Blood pressure 93/71, pulse 83, temperature 98.8 F (37.1 C), temperature source Oral, resp. rate 17, height 5\' 5"  (1.651 m), weight 109.1 kg (240 lb 8 oz), SpO2 92 %.  PHYSICAL EXAMINATION:  GENERAL:  61 y.o.-year-old patient lying in the bed with no acute distress.  EYES: Pupils equal, round, reactive to light and accommodation. No scleral icterus. Extraocular muscles intact.  HEENT: Head atraumatic, normocephalic. Oropharynx and nasopharynx clear.  NECK:  Supple, no jugular venous distention. No thyroid enlargement, no tenderness.  LUNGS: Moderate breath sounds on right side and diminished breath sounds on left side, no wheezing, rales,rhonchi or  crepitation. No use of accessory muscles of respiration.  CARDIOVASCULAR: S1, S2 normal. No murmurs, rubs, or gallops.  ABDOMEN: Soft, nontender, nondistended. Bowel sounds present. No organomegaly or mass.  EXTREMITIES: No pedal edema, cyanosis, or clubbing.  NEUROLOGIC: Cranial nerves II through XII are intact. Muscle strength 5/5 in all extremities. Sensation intact. Gait not checked.  PSYCHIATRIC: The patient is alert and oriented x 3.  SKIN: No obvious rash, lesion, or ulcer.    LABORATORY PANEL:   CBC Recent Labs  Lab 03/05/17 0432  WBC 20.1*  HGB 11.7*  HCT 36.3  PLT 253   ------------------------------------------------------------------------------------------------------------------  Chemistries  Recent Labs  Lab 03/04/17 0653  NA 136  K 4.2  CL 105  CO2 24  GLUCOSE 157*  BUN 21*  CREATININE 0.91  CALCIUM 8.4*   ------------------------------------------------------------------------------------------------------------------  Cardiac Enzymes Recent Labs  Lab 03/02/17 2028  TROPONINI <0.03   ------------------------------------------------------------------------------------------------------------------  RADIOLOGY:  Ct Chest Wo Contrast  Result Date: 03/04/2017 CLINICAL DATA:  Worsening of pneumonia. EXAM: CT CHEST WITHOUT CONTRAST TECHNIQUE: Multidetector CT imaging of the chest was performed following the standard protocol without IV contrast. COMPARISON:  Radiography same day.  CT 03/02/2017 FINDINGS: Cardiovascular: Aortic atherosclerosis. Coronary artery calcification and stents. Mediastinum/Nodes: No mass or lymphadenopathy. Lungs/Pleura: Marked worsening. On the right, the patient has developed consolidation and volume loss in the right lower lobe and to a lesser extent in the right middle lobe. No pleural fluid on the right. On the left, the patient has developed consolidation throughout the left lower lobe and a large amount of pleural fluid which is  partially loculated consistent with empyema. Upper Abdomen: Negative Musculoskeletal: Unremarkable IMPRESSION: Marked worsening. Complete consolidation and collapse of the  left lower lobe. Large amount of partially loculated pleural fluid on the left consistent with empyema. New infiltrate and volume loss in the right lower lobe and to a lesser extent the right middle lobe. Electronically Signed   By: Paulina FusiMark  Shogry M.D.   On: 03/04/2017 13:47   Dg Chest Port 1 View  Result Date: 03/04/2017 CLINICAL DATA:  61 year old female with chest pain and shortness of breath EXAM: PORTABLE CHEST 1 VIEW COMPARISON:  Recent prior chest x-ray and CT scan of the chest 03/03/2007 FINDINGS: Interval development of a moderately large left-sided pleural effusion with associated left lower lobe atelectasis. Patchy airspace opacity is now present in the right lower lobe. The pleural effusion has convex margins raising the possibility of loculation. Cardiac and mediastinal contours remain unchanged. Atherosclerotic calcifications are present in the transverse aorta. No evidence pneumothorax. No acute osseous abnormality. IMPRESSION: 1. Interval development of a moderately large left-sided pleural effusion with convex margins raising the possibility of internal complexity/loculation. Differential considerations include a parapneumonic effusion and empyema. Recommend further evaluation with CT scan of the chest with intravenous contrast. 2. New patchy airspace opacity in the right lung base concerning for developing multifocal pneumonia. 3.  Aortic Atherosclerosis (ICD10-170.0) Electronically Signed   By: Malachy MoanHeath  McCullough M.D.   On: 03/04/2017 08:46    EKG:   Orders placed or performed during the hospital encounter of 03/02/17  . ED EKG within 10 minutes  . ED EKG within 10 minutes  . EKG 12-Lead  . EKG 12-Lead    ASSESSMENT AND PLAN:     * Ac respi failure with hypoxia secondary to pneumonia with moderately large  loculated left-sided pleural effusion Clinically hypoxia getting worse requiring nonrebreather Appreciated help by Intensivist- Will need IR guided thoracentesis and possible decortication by CT surgeon.     *Pneumonia with pleuritic chest pain   IV rocephin+ Azithromycin changed to Zosyn and vancomycin    Bl cx negative and urine culture is contaminated     continue bronchodilator therapy with nebulizer treatments  nonrebreather. Solu-Medrol added to the regimen   * Hx of smoking But quit smoking 3 years ago   Denies COPD, will give duoneb now, try to taper oxygen.  * DM   ISs. Hold oral meds  Lantus 20 units Check hemoglobin A1c  * Htn    Cont home meds, hold diuretics  * Hyperlipidemia    Cont statin.  * Hx of CAD    Cont home meds bisoprolol, Cozaar, statin Continue aspirin   All the records are reviewed and case discussed with Care Management/Social Workerr. Management plans discussed with the patient, she prefers calling her friend and family members.  CODE STATUS: FC, healthcare power of attorney friend Aaren-[phone]  TOTAL TIME TAKING CARE OF THIS PATIENT: 35  minutes.   POSSIBLE D/C IN 1-2  DAYS, DEPENDING ON CLINICAL CONDITION.  Note: This dictation was prepared with Dragon dictation along with smaller phrase technology. Any transcriptional errors that result from this process are unintentional.   Altamese DillingVaibhavkumar Debe Anfinson M.D on 03/05/2017 at 7:48 AM  Between 7am to 6pm - Pager - [phone] After 6pm go to [email or web address] - password [email or web address]  [facility]  Office  [phone]  CC: Primary care physician; System, Pcp Not In

## 2017-03-06 ENCOUNTER — Inpatient Hospital Stay: Payer: BLUE CROSS/BLUE SHIELD

## 2017-03-06 LAB — GLUCOSE, CAPILLARY
GLUCOSE-CAPILLARY: 126 mg/dL — AB (ref 65–99)
GLUCOSE-CAPILLARY: 139 mg/dL — AB (ref 65–99)
GLUCOSE-CAPILLARY: 140 mg/dL — AB (ref 65–99)
GLUCOSE-CAPILLARY: 146 mg/dL — AB (ref 65–99)
GLUCOSE-CAPILLARY: 156 mg/dL — AB (ref 65–99)
GLUCOSE-CAPILLARY: 199 mg/dL — AB (ref 65–99)
GLUCOSE-CAPILLARY: 220 mg/dL — AB (ref 65–99)
Glucose-Capillary: 154 mg/dL — ABNORMAL HIGH (ref 65–99)
Glucose-Capillary: 142 mg/dL — ABNORMAL HIGH (ref 65–99)
Glucose-Capillary: 167 mg/dL — ABNORMAL HIGH (ref 65–99)
Glucose-Capillary: 168 mg/dL — ABNORMAL HIGH (ref 65–99)

## 2017-03-06 LAB — BODY FLUID CELL COUNT WITH DIFFERENTIAL
Eos, Fluid: 0 %
Lymphs, Fluid: 13 %
Monocyte-Macrophage-Serous Fluid: 5 %
NEUTROPHIL FLUID: 82 %
Other Cells, Fluid: 0 %
WBC FLUID: 3092 uL

## 2017-03-06 LAB — BASIC METABOLIC PANEL
ANION GAP: 10 (ref 5–15)
BUN: 40 mg/dL — ABNORMAL HIGH (ref 6–20)
CALCIUM: 8.8 mg/dL — AB (ref 8.9–10.3)
CO2: 21 mmol/L — AB (ref 22–32)
CREATININE: 0.72 mg/dL (ref 0.44–1.00)
Chloride: 108 mmol/L (ref 101–111)
Glucose, Bld: 164 mg/dL — ABNORMAL HIGH (ref 65–99)
Potassium: 4.3 mmol/L (ref 3.5–5.1)
Sodium: 139 mmol/L (ref 135–145)

## 2017-03-06 LAB — GLUCOSE, PLEURAL OR PERITONEAL FLUID: Glucose, Fluid: <20 mg/dL

## 2017-03-06 LAB — VANCOMYCIN, RANDOM: Vancomycin Rm: 13

## 2017-03-06 LAB — PROTEIN, PLEURAL OR PERITONEAL FLUID: Total protein, fluid: 3.9 g/dL

## 2017-03-06 LAB — PROTIME-INR
INR: 1.04
Prothrombin Time: 13.5 seconds (ref 11.4–15.2)

## 2017-03-06 LAB — LACTATE DEHYDROGENASE, PLEURAL OR PERITONEAL FLUID: LD FL: 2115 U/L — AB (ref 3–23)

## 2017-03-06 LAB — MRSA PCR SCREENING: MRSA BY PCR: NEGATIVE

## 2017-03-06 LAB — VANCOMYCIN, TROUGH: Vancomycin Tr: 22 ug/mL (ref 15–20)

## 2017-03-06 MED ORDER — FENTANYL CITRATE (PF) 100 MCG/2ML IJ SOLN
INTRAMUSCULAR | Status: AC
Start: 2017-03-06 — End: 2017-03-07
  Filled 2017-03-06: qty 2

## 2017-03-06 MED ORDER — SIMETHICONE 80 MG PO CHEW
80.0000 mg | CHEWABLE_TABLET | Freq: Four times a day (QID) | ORAL | Status: DC | PRN
  Administered 2017-03-06 – 2017-03-10 (×9): 80 mg via ORAL
  Filled 2017-03-06 (×10): qty 1

## 2017-03-06 MED ORDER — DEXTROSE 10 % IV SOLN: INTRAVENOUS | Status: DC | PRN

## 2017-03-06 MED ORDER — INSULIN GLARGINE 100 UNIT/ML ~~LOC~~ SOLN
10.0000 [IU] | SUBCUTANEOUS | Status: DC
  Administered 2017-03-06 – 2017-03-07 (×2): 10 [IU] via SUBCUTANEOUS
  Filled 2017-03-06 (×2): qty 0.1

## 2017-03-06 MED ORDER — MIDAZOLAM HCL 2 MG/2ML IJ SOLN
INTRAMUSCULAR | Status: AC
  Filled 2017-03-06: qty 2

## 2017-03-06 MED ORDER — MIDAZOLAM HCL 2 MG/2ML IJ SOLN
INTRAMUSCULAR | Status: DC | PRN
  Administered 2017-03-06 (×2): 1 mg via INTRAVENOUS

## 2017-03-06 MED ORDER — INSULIN ASPART 100 UNIT/ML ~~LOC~~ SOLN
2.0000 [IU] | SUBCUTANEOUS | Status: DC
  Administered 2017-03-06: 6 [IU] via SUBCUTANEOUS
  Administered 2017-03-06 – 2017-03-07 (×5): 4 [IU] via SUBCUTANEOUS
  Administered 2017-03-07: 6 [IU] via SUBCUTANEOUS
  Filled 2017-03-06 (×7): qty 1

## 2017-03-06 MED ORDER — METHYLPREDNISOLONE SODIUM SUCC 40 MG IJ SOLR
40.0000 mg | Freq: Two times a day (BID) | INTRAMUSCULAR | Status: DC
Start: 2017-03-06 — End: 2017-03-11
  Administered 2017-03-06 – 2017-03-11 (×10): 40 mg via INTRAVENOUS
  Filled 2017-03-06 (×10): qty 1

## 2017-03-06 MED ORDER — INSULIN GLARGINE 100 UNIT/ML ~~LOC~~ SOLN
10.0000 [IU] | SUBCUTANEOUS | Status: DC
  Filled 2017-03-06: qty 0.1

## 2017-03-06 MED ORDER — BUDESONIDE 0.5 MG/2ML IN SUSP
0.5000 mg | Freq: Two times a day (BID) | RESPIRATORY_TRACT | Status: DC
  Administered 2017-03-06 – 2017-03-10 (×8): 0.5 mg via RESPIRATORY_TRACT
  Filled 2017-03-06 (×10): qty 2

## 2017-03-06 MED ORDER — LIDOCAINE HCL 1 % IJ SOLN
INTRAMUSCULAR | Status: DC | PRN
  Administered 2017-03-06: 8 mL via INTRADERMAL

## 2017-03-06 MED ORDER — VANCOMYCIN HCL IN DEXTROSE 750-5 MG/150ML-% IV SOLN
750.0000 mg | Freq: Three times a day (TID) | INTRAVENOUS | Status: DC
  Administered 2017-03-06 – 2017-03-08 (×7): 750 mg via INTRAVENOUS
  Filled 2017-03-06 (×9): qty 150

## 2017-03-06 MED ORDER — VANCOMYCIN HCL IN DEXTROSE 1-5 GM/200ML-% IV SOLN
1000.0000 mg | Freq: Two times a day (BID) | INTRAVENOUS | Status: DC
  Filled 2017-03-06: qty 200

## 2017-03-06 MED ORDER — IPRATROPIUM-ALBUTEROL 0.5-2.5 (3) MG/3ML IN SOLN
3.0000 mL | RESPIRATORY_TRACT | Status: DC | PRN
  Administered 2017-03-10: 3 mL via RESPIRATORY_TRACT
  Filled 2017-03-06: qty 3

## 2017-03-06 MED ORDER — FENTANYL CITRATE (PF) 100 MCG/2ML IJ SOLN
INTRAMUSCULAR | Status: DC | PRN
  Administered 2017-03-06 (×3): 25 ug via INTRAVENOUS
  Administered 2017-03-06: 50 ug via INTRAVENOUS

## 2017-03-06 NOTE — Progress Notes (Signed)
[facility] - [place] at [facility]   PATIENT NAME: Erin Russell    MR#:  [id number]  DATE OF BIRTH:  May 06, 1955  SUBJECTIVE:  CHIEF COMPLAINT:  Patient is still reporting chest pain with deep inspirations. Was significantly short of breath and hypoxic ,requiring nonrebreather so transferred to step down 03/04/17. Changed to Vanc + zosyn. Showed complicated parapneumonic effusion, feels little better today. Awaited to have pleural tap and tube placement. On HFNC.  REVIEW OF SYSTEMS:  CONSTITUTIONAL: No fever, fatigue or weakness.  EYES: No blurred or double vision.  EARS, NOSE, AND THROAT: No tinnitus or ear pain.  RESPIRATORY: No cough, reporting exertional shortness of breath, denies wheezing or hemoptysis.  CARDIOVASCULAR: Patient is reporting chest pain with deep inspirations , denies orthopnea, edema.  GASTROINTESTINAL: No nausea, vomiting, diarrhea or abdominal pain.  GENITOURINARY: No dysuria, hematuria.  ENDOCRINE: No polyuria, nocturia,  HEMATOLOGY: No anemia, easy bruising or bleeding SKIN: No rash or lesion. MUSCULOSKELETAL: No joint pain or arthritis.   NEUROLOGIC: No tingling, numbness, weakness.  PSYCHIATRY: No anxiety or depression.   DRUG ALLERGIES:   Allergies  Allergen Reactions  . Codeine   . Latex Hives and Swelling    VITALS:  Blood pressure 133/82, pulse 98, temperature 98 F (36.7 C), temperature source Oral, resp. rate (!) 25, height 5\' 5"  (1.651 m), weight 109.1 kg (240 lb 8 oz), SpO2 94 %.  PHYSICAL EXAMINATION:  GENERAL:  61 y.o.-year-old patient lying in the bed with no acute distress.  EYES: Pupils equal, round, reactive to light and accommodation. No scleral icterus. Extraocular muscles intact.  HEENT: Head atraumatic, normocephalic. Oropharynx and nasopharynx clear.  NECK:  Supple, no jugular venous distention. No thyroid enlargement, no tenderness.  LUNGS: Moderate breath sounds on right side and diminished breath  sounds on left side, no wheezing, rales,rhonchi or crepitation. No use of accessory muscles of respiration. On HFNC. CARDIOVASCULAR: S1, S2 normal. No murmurs, rubs, or gallops.  ABDOMEN: Soft, nontender, nondistended. Bowel sounds present. No organomegaly or mass.  EXTREMITIES: No pedal edema, cyanosis, or clubbing.  NEUROLOGIC: Cranial nerves II through XII are intact. Muscle strength 5/5 in all extremities. Sensation intact. Gait not checked.  PSYCHIATRIC: The patient is alert and oriented x 3.  SKIN: No obvious rash, lesion, or ulcer.    LABORATORY PANEL:   CBC Recent Labs  Lab 03/05/17 0432  WBC 20.1*  HGB 11.7*  HCT 36.3  PLT 253   ------------------------------------------------------------------------------------------------------------------  Chemistries  Recent Labs  Lab 03/06/17 0500  NA 139  K 4.3  CL 108  CO2 21*  GLUCOSE 164*  BUN 40*  CREATININE 0.72  CALCIUM 8.8*   ------------------------------------------------------------------------------------------------------------------  Cardiac Enzymes Recent Labs  Lab 03/02/17 2028  TROPONINI <0.03   ------------------------------------------------------------------------------------------------------------------  RADIOLOGY:  Dg Chest Port 1 View  Result Date: 03/05/2017 CLINICAL DATA:  Continued SOB but somewhat improved. Complicated pleural space, pneumonia with loculation concerning for empyema. Unable to get good acoustic windows at the bedside. Chest CT shows 2 areas of loculation. Hx - .hypertension, hyperlipidemia, diabetes mellitus, coronary artery disease, status post cardiac catheterization and stent placement, arthritis, with pneumonia, complicated pleural space with some evidence loculation. EXAM: PORTABLE CHEST 1 VIEW COMPARISON:  03/04/2017 FINDINGS: Loculated left pleural effusion is stable. There is associated parenchymal lung opacity consistent with atelectasis and/or pneumonia, also without  change from the previous day's exams. Mild stable right lung base atelectasis. No new lung abnormalities. No pneumothorax. IMPRESSION: 1. No change from the previous  day's studies. 2. Persistent loculated left pleural effusion with associated lung consolidation/atelectasis. Mild right lung base atelectasis. No evidence of pulmonary edema. Electronically Signed   By: Amie Portlandavid  Ormond M.D.   On: 03/05/2017 10:18   Ct Image Guided Drainage By Percutaneous Catheter  Result Date: 03/06/2017 INDICATION: 61 year old with complex left pleural effusion and probable pneumonia. Plan for a CT-guided chest tube placement. EXAM: CT GUIDED PLACEMENT OF LEFT CHEST TUBE MEDICATIONS: None ANESTHESIA/SEDATION: 2.0 mg IV Versed 100 mcg IV Fentanyl Moderate Sedation Time:  38 minutes The patient was continuously monitored during the procedure by the interventional radiology nurse under my direct supervision. COMPLICATIONS: None immediate. TECHNIQUE: Informed written consent was obtained from the patient after a thorough discussion of the procedural risks, benefits and alternatives. All questions were addressed. Maximal Sterile Barrier Technique was utilized including caps, mask, sterile gowns, sterile gloves, sterile drape, hand hygiene and skin antiseptic. A timeout was performed prior to the initiation of the procedure. PROCEDURE: Patient was placed on her right side. CT images through the chest were obtained. Left side of the chest was prepped and draped in sterile fashion. Skin was anesthetized with 1% lidocaine. 18 gauge trocar needle was directed into the posterior pleural space with CT guidance. Amber colored fluid was aspirated. A stiff Amplatz wire was placed. The tract was dilated to accommodate a 12 JamaicaFrench multipurpose drain. Approximately 40 mL of fluid was removed for analysis. Catheter sutured to skin and attached to a pleural evacuation device. FINDINGS: Left pleural effusion with consolidation in the left lower lung.  Multiple pockets of pleural gas following placement of the chest drain is suggestive for a loculated collection. IMPRESSION: CT-guided placement of left chest tube. Evidence for loculated left pleural effusion. Electronically Signed   By: Richarda OverlieAdam  Henn M.D.   On: 03/06/2017 15:58    EKG:   Orders placed or performed during the hospital encounter of 03/02/17  . ED EKG within 10 minutes  . ED EKG within 10 minutes  . EKG 12-Lead  . EKG 12-Lead  . EKG 12-Lead  . EKG 12-Lead    ASSESSMENT AND PLAN:   * Ac respi failure with hypoxia secondary to pneumonia with moderately large loculated left-sided pleural effusion Clinically hypoxia getting worse requiring nonrebreather Appreciated help by Intensivist- Will need IR guided thoracentesis and possible decortication by CT surgeon.    Plan for radiological procedure today.    *Pneumonia with pleuritic chest pain   IV rocephin+ Azithromycin changed to Zosyn and vancomycin    Bl cx negative and urine culture is contaminated     continue bronchodilator therapy with nebulizer treatments  on HFNC. Solu-Medrol added to the regimen  * Hx of smoking But quit smoking 3 years ago   Denies COPD, will give duoneb now, try to taper oxygen.  * DM   ISs. Hold oral meds  Lantus 20 units Check hemoglobin A1c  * Htn    Cont home meds, hold diuretics  * Hyperlipidemia    Cont statin.  * Hx of CAD    Cont home meds bisoprolol, Cozaar, statin Continue aspirin  All the records are reviewed and case discussed with Care Management/Social Workerr. Management plans discussed with the patient, she prefers calling her friend and family members.  CODE STATUS: FC, healthcare power of attorney friend Aaren-[phone]  TOTAL TIME TAKING CARE OF THIS PATIENT: 35  minutes.   POSSIBLE D/C IN 1-2  DAYS, DEPENDING ON CLINICAL CONDITION.  Note: This dictation was prepared with Dragon dictation  along with smaller phrase technology. Any transcriptional  errors that result from this process are unintentional.   Altamese DillingVaibhavkumar Krisa Blattner M.D on 03/06/2017 at 6:22 PM  Between 7am to 6pm - Pager - [phone] After 6pm go to [email or web address] - password [email or web address]  [facility]  Office  [phone]  CC: Primary care physician; System, Pcp Not In

## 2017-03-06 NOTE — Progress Notes (Signed)
  [facility] Progess Note     Name: Erin HemanDeborah Russell MRN: [id number] DOB: 07/14/1955    ADMISSION DATE:  03/02/2017   SYNOPSIS   61 year old Caucasian female with past medical history remarkable for hypertension, hyperlipidemia, diabetes mellitus, coronary artery disease, status post cardiac catheterization and stent placement, arthritis, with pneumonia, complicated pleural space with some evidence loculation.  Patient with severe hypoxic resp failure with severe Left sided pneumonia  ASSESSMENT/PLAN   Complicated pleural space, pneumonia with loculation concerning for empyema. Unable to get good acoustic windows at the bedside. Chest CT shows 2 areas of loculation. Pending IR guided drainage, continue Zosyn and vancomycin, may need CT surgery consultation for possible decortication if does not improve. Will repeat chest x-ray today  Would treat as COPD exacerbation, patient with previous extensive smoking history   SUBJECTIVE:   Patient states she is breathing better, her shortness of breath and pleuritic chest pain has improved some and status post CT scan of the chest  Awaiting IR pig tail catheter placement   VITAL SIGNS: Temp:  [97.7 F (36.5 C)-98.4 F (36.9 C)] 98.2 F (36.8 C) (12/10 0734) Pulse Rate:  [84-111] 111 (12/10 0828) Resp:  [21-30] 30 (12/10 0828) BP: (115-143)/(72-97) 130/87 (12/10 0828) SpO2:  [90 %-98 %] 94 % (12/10 0828) FiO2 (%):  [55 %-80 %] 60 % (12/10 0822)    VS: BP 130/87   Pulse (!) 111   Temp 98.2 F (36.8 C) (Oral)   Resp (!) 30   Ht 5\' 5"  (1.651 m)   Wt 240 lb 8 oz (109.1 kg)   SpO2 94%   BMI 40.02 kg/m    General Appearance: + resp  distress  Neuro:without focal findings, mental status normal. HEENT: PERRLA, EOM intact. Pulmonary: Inspiratory crackles appreciated in the left posterior lung zone Cardiovascular sinus mechanism on the monitor Abdomen: Benign, Soft, non-tender. Endocrine: No evident  thyromegaly. Skin:   warm, no rashes, no ecchymosis  Extremities: normal, no cyanosis, clubbing.  LABORATORY PANEL:   CBC Recent Labs  Lab 03/05/17 0432  WBC 20.1*  HGB 11.7*  HCT 36.3  PLT 253    Chemistries  Recent Labs  Lab 03/06/17 0500  NA 139  K 4.3  CL 108  CO2 21*  GLUCOSE 164*  BUN 40*  CREATININE 0.72  CALCIUM 8.8*    Recent Labs  Lab 03/06/17 0145 03/06/17 0241 03/06/17 0351 03/06/17 0444 03/06/17 0550 03/06/17 0827  GLUCAP 139* 126* 146* 154* 142* 167*   Recent Labs  Lab 03/04/17 0746  PHART 7.40  PCO2ART 37  PO2ART 49*   No results for input(s): AST, ALT, ALKPHOS, BILITOT, ALBUMIN in the last 168 hours.  Cardiac Enzymes Recent Labs  Lab 03/02/17 2028  TROPONINI <0.03    Patient remains step down status   Lucie LeatherKurian David Tija Biss, M.D.  [facility]  Medical Director [facility] Medical Director [facility] Card[facility]

## 2017-03-06 NOTE — Progress Notes (Signed)
Patient left ICU with Revonda StandardAllison, RN.  Patient going to CT.  Report given to Revonda StandardAllison, RN at bedside.

## 2017-03-06 NOTE — Progress Notes (Signed)
Pharmacy Antibiotic Note  Erin Russell is a 61 y.o. female admitted on 03/02/2017 with sepsis.  Pharmacy has been consulted for vancomycin and zosyn dosing.  Plan: Vancomycin 1250mg  IV every 12 hours.  Goal trough 15-20 mcg/mL. Zosyn 3.375g IV q8h (4 hour infusion). Vancomycin trough 12/10@0130  before 4th dose   12/10 @ 0120 VT 22 slightly supratherapeutic, dose on 12/9 given @ 1830 (4 hrs late), based on that dose trough level drawn 5 hrs too early. Will hold off vanc for now and draw a vanc random and a BMP to assess renal function @ 0500 and will reassess.  Height: 5\' 5"  (165.1 cm) Weight: 240 lb 8 oz (109.1 kg) IBW/kg (Calculated) : 57  Temp (24hrs), Avg:98.3 F (36.8 C), Min:97.7 F (36.5 C), Max:98.8 F (37.1 C)  Recent Labs  Lab 03/02/17 2028 03/02/17 2306 03/03/17 0219 03/03/17 0607 03/04/17 0653 03/05/17 0432 03/06/17 0120  WBC 16.2*  --   --  15.4* 18.3* 20.1*  --   CREATININE 0.95  --   --   --  0.91  --   --   LATICACIDVEN  --  1.4 1.6  --   --   --   --   VANCOTROUGH  --   --   --   --   --   --  22*    Estimated Creatinine Clearance: 79.7 mL/min (by C-G formula based on SCr of 0.91 mg/dL).    Allergies  Allergen Reactions  . Codeine   . Latex Hives and Swelling    Antimicrobials this admission: Anti-infectives (From admission, onward)   Start     Dose/Rate Route Frequency Ordered Stop   03/06/17 0500  vancomycin (VANCOCIN) IVPB 1000 mg/200 mL premix     1,000 mg 200 mL/hr over 60 Minutes Intravenous Every 12 hours 03/06/17 0209     03/04/17 1400  vancomycin (VANCOCIN) 1,250 mg in sodium chloride 0.9 % 250 mL IVPB  Status:  Discontinued     1,250 mg 166.7 mL/hr over 90 Minutes Intravenous Every 12 hours 03/04/17 1143 03/06/17 0209   03/04/17 1000  cefTRIAXone (ROCEPHIN) 1 g in dextrose 5 % 50 mL injection  Status:  Discontinued      Intravenous Every 24 hours 03/03/17 1644 03/04/17 0837   03/04/17 1000  piperacillin-tazobactam (ZOSYN) IVPB 3.375 g      3.375 g 12.5 mL/hr over 240 Minutes Intravenous Every 8 hours 03/04/17 0954     03/04/17 1000  vancomycin (VANCOCIN) IVPB 1000 mg/200 mL premix     1,000 mg 200 mL/hr over 60 Minutes Intravenous  Once 03/04/17 0954     03/03/17 1000  cefTRIAXone (ROCEPHIN) 1 g in dextrose 5 % 50 mL IVPB - Premix  Status:  Discontinued     1 g 100 mL/hr over 30 Minutes Intravenous Every 24 hours 03/03/17 0006 03/03/17 1643   03/03/17 1000  azithromycin (ZITHROMAX) 500 mg in dextrose 5 % 250 mL IVPB  Status:  Discontinued     500 mg 250 mL/hr over 60 Minutes Intravenous Every 24 hours 03/03/17 0040 03/04/17 0837   03/03/17 0015  azithromycin (ZITHROMAX) 250 mg in dextrose 5 % 125 mL IVPB  Status:  Discontinued     250 mg 125 mL/hr over 60 Minutes Intravenous Every 24 hours 03/03/17 0006 03/03/17 0040   03/02/17 2300  cefTRIAXone (ROCEPHIN) 1 g in dextrose 5 % 50 mL IVPB - Premix     1 g 100 mL/hr over 30 Minutes Intravenous  Once  03/02/17 2250 03/03/17 0001   03/02/17 2300  azithromycin (ZITHROMAX) 500 mg in dextrose 5 % 250 mL IVPB     500 mg 250 mL/hr over 60 Minutes Intravenous  Once 03/02/17 2250 03/03/17 0026      Microbiology results: Recent Results (from the past 240 hour(s))  Blood culture (routine x 2)     Status: None (Preliminary result)   Collection Time: 03/02/17 11:06 PM  Result Value Ref Range Status   Specimen Description BLOOD LT FOREARM  Final   Special Requests   Final    BOTTLES DRAWN AEROBIC AND ANAEROBIC Blood Culture results may not be optimal due to an excessive volume of blood received in culture bottles   Culture NO GROWTH 3 DAYS  Final   Report Status PENDING  Incomplete  Blood culture (routine x 2)     Status: None (Preliminary result)   Collection Time: 03/02/17 11:06 PM  Result Value Ref Range Status   Specimen Description BLOOD RT HAND  Final   Special Requests   Final    BOTTLES DRAWN AEROBIC AND ANAEROBIC Blood Culture adequate volume   Culture NO GROWTH 3  DAYS  Final   Report Status PENDING  Incomplete  Urine Culture     Status: Abnormal   Collection Time: 03/02/17 11:53 PM  Result Value Ref Range Status   Specimen Description URINE, RANDOM  Final   Special Requests Normal  Final   Culture MULTIPLE SPECIES PRESENT, SUGGEST RECOLLECTION (A)  Final   Report Status 03/04/2017 FINAL  Final  Culture, expectorated sputum-assessment     Status: None   Collection Time: 03/05/17  5:50 PM  Result Value Ref Range Status   Specimen Description EXPECTORATED SPUTUM  Final   Special Requests Normal  Final   Sputum evaluation THIS SPECIMEN IS ACCEPTABLE FOR SPUTUM CULTURE  Final   Report Status 03/05/2017 FINAL  Final    Thank you for allowing pharmacy to be a part of this patient's care.  Thomasene Rippleavid Margot Oriordan, PharmD, BCPS Clinical Pharmacist 03/06/2017

## 2017-03-06 NOTE — Progress Notes (Signed)
Pharmacy Antibiotic Note  Erin Russell is a 61 y.o. female admitted on 03/02/2017 with sepsis.  Pharmacy has been consulted for vancomycin and zosyn dosing.  Plan: Vancomycin 1250mg  IV every 12 hours.  Goal trough 15-20 mcg/mL. Zosyn 3.375g IV q8h (4 hour infusion). Vancomycin trough 12/10@0130  before 4th dose   12/10 @ 0120 VT 22 slightly supratherapeutic, dose on 12/9 given @ 1830 (4 hrs late), based on that dose trough level drawn 5 hrs too early. Will hold off vanc for now and draw a vanc random and a BMP to assess renal function @ 0500 and will reassess.  12/10 @ 0500 VR 13. Will place patient on a dose of vanc 750 mg IV q8h and will check a vanc trough 12/11 @ 0500 prior to 4th dose. Scr 0.95 >> 0.72. Renal function improving.  Height: 5\' 5"  (165.1 cm) Weight: 240 lb 8 oz (109.1 kg) IBW/kg (Calculated) : 57  Temp (24hrs), Avg:98.1 F (36.7 C), Min:97.7 F (36.5 C), Max:98.4 F (36.9 C)  Recent Labs  Lab 03/02/17 2028 03/02/17 2306 03/03/17 0219 03/03/17 0607 03/04/17 0653 03/05/17 0432 03/06/17 0120 03/06/17 0500  WBC 16.2*  --   --  15.4* 18.3* 20.1*  --   --   CREATININE 0.95  --   --   --  0.91  --   --  0.72  LATICACIDVEN  --  1.4 1.6  --   --   --   --   --   VANCOTROUGH  --   --   --   --   --   --  22*  --   VANCORANDOM  --   --   --   --   --   --   --  13    Estimated Creatinine Clearance: 90.7 mL/min (by C-G formula based on SCr of 0.72 mg/dL).    Allergies  Allergen Reactions  . Codeine   . Latex Hives and Swelling    Antimicrobials this admission: Anti-infectives (From admission, onward)   Start     Dose/Rate Route Frequency Ordered Stop   03/06/17 0645  vancomycin (VANCOCIN) IVPB 750 mg/150 ml premix     750 mg 150 mL/hr over 60 Minutes Intravenous Every 8 hours 03/06/17 0634     03/06/17 0500  vancomycin (VANCOCIN) IVPB 1000 mg/200 mL premix  Status:  Discontinued     1,000 mg 200 mL/hr over 60 Minutes Intravenous Every 12 hours 03/06/17  0209 03/06/17 0214   03/04/17 1400  vancomycin (VANCOCIN) 1,250 mg in sodium chloride 0.9 % 250 mL IVPB  Status:  Discontinued     1,250 mg 166.7 mL/hr over 90 Minutes Intravenous Every 12 hours 03/04/17 1143 03/06/17 0209   03/04/17 1000  cefTRIAXone (ROCEPHIN) 1 g in dextrose 5 % 50 mL injection  Status:  Discontinued      Intravenous Every 24 hours 03/03/17 1644 03/04/17 0837   03/04/17 1000  piperacillin-tazobactam (ZOSYN) IVPB 3.375 g     3.375 g 12.5 mL/hr over 240 Minutes Intravenous Every 8 hours 03/04/17 0954     03/04/17 1000  vancomycin (VANCOCIN) IVPB 1000 mg/200 mL premix     1,000 mg 200 mL/hr over 60 Minutes Intravenous  Once 03/04/17 0954     03/03/17 1000  cefTRIAXone (ROCEPHIN) 1 g in dextrose 5 % 50 mL IVPB - Premix  Status:  Discontinued     1 g 100 mL/hr over 30 Minutes Intravenous Every 24 hours 03/03/17 0006 03/03/17 1643  03/03/17 1000  azithromycin (ZITHROMAX) 500 mg in dextrose 5 % 250 mL IVPB  Status:  Discontinued     500 mg 250 mL/hr over 60 Minutes Intravenous Every 24 hours 03/03/17 0040 03/04/17 0837   03/03/17 0015  azithromycin (ZITHROMAX) 250 mg in dextrose 5 % 125 mL IVPB  Status:  Discontinued     250 mg 125 mL/hr over 60 Minutes Intravenous Every 24 hours 03/03/17 0006 03/03/17 0040   03/02/17 2300  cefTRIAXone (ROCEPHIN) 1 g in dextrose 5 % 50 mL IVPB - Premix     1 g 100 mL/hr over 30 Minutes Intravenous  Once 03/02/17 2250 03/03/17 0001   03/02/17 2300  azithromycin (ZITHROMAX) 500 mg in dextrose 5 % 250 mL IVPB     500 mg 250 mL/hr over 60 Minutes Intravenous  Once 03/02/17 2250 03/03/17 0026      Microbiology results: Recent Results (from the past 240 hour(s))  Blood culture (routine x 2)     Status: None (Preliminary result)   Collection Time: 03/02/17 11:06 PM  Result Value Ref Range Status   Specimen Description BLOOD LT FOREARM  Final   Special Requests   Final    BOTTLES DRAWN AEROBIC AND ANAEROBIC Blood Culture results may not be  optimal due to an excessive volume of blood received in culture bottles   Culture NO GROWTH 4 DAYS  Final   Report Status PENDING  Incomplete  Blood culture (routine x 2)     Status: None (Preliminary result)   Collection Time: 03/02/17 11:06 PM  Result Value Ref Range Status   Specimen Description BLOOD RT HAND  Final   Special Requests   Final    BOTTLES DRAWN AEROBIC AND ANAEROBIC Blood Culture adequate volume   Culture NO GROWTH 4 DAYS  Final   Report Status PENDING  Incomplete  Urine Culture     Status: Abnormal   Collection Time: 03/02/17 11:53 PM  Result Value Ref Range Status   Specimen Description URINE, RANDOM  Final   Special Requests Normal  Final   Culture MULTIPLE SPECIES PRESENT, SUGGEST RECOLLECTION (A)  Final   Report Status 03/04/2017 FINAL  Final  Culture, expectorated sputum-assessment     Status: None   Collection Time: 03/05/17  5:50 PM  Result Value Ref Range Status   Specimen Description EXPECTORATED SPUTUM  Final   Special Requests Normal  Final   Sputum evaluation THIS SPECIMEN IS ACCEPTABLE FOR SPUTUM CULTURE  Final   Report Status 03/05/2017 FINAL  Final    Thank you for allowing pharmacy to be a part of this patient's care.  Thomasene Rippleavid Alejos Reinhardt, PharmD, BCPS Clinical Pharmacist 03/06/2017

## 2017-03-06 NOTE — Procedures (Signed)
CT-guided left chest tube placement (12 Fr).  Removed cloudy amber colored fluid.  Minimal blood loss and no immediate complication.

## 2017-03-06 NOTE — Progress Notes (Signed)
Patient ID: Erin HemanDeborah Russell, female   DOB: 02/13/1956, 61 y.o.   MRN: [id number] 61 year old with respiratory distress and probable pneumonia based on the recent CT imaging. Patient appears to have a complex left pleural effusion. Request for an image guided chest tube placement. Patient's History and Physical has been reviewed. Patient is breathing better since she has been in the hospital.  Physical exam: Lungs: Right lung is clear to auscultation. Decreased breath sounds at the left lung base. Heart: Regular rate and rhythm \\Abdomen : Soft, bowel sounds present.  Assessment: 61 year old with a complex pleural effusion and probable pneumonia. Plan for CT guided left chest tube placement. Procedure was discussed with the patient and informed consent was obtained. Plan to use moderate sedation for tube placement.

## 2017-03-07 ENCOUNTER — Inpatient Hospital Stay: Payer: BLUE CROSS/BLUE SHIELD

## 2017-03-07 DIAGNOSIS — J441 Chronic obstructive pulmonary disease with (acute) exacerbation: Secondary | ICD-10-CM

## 2017-03-07 DIAGNOSIS — J90 Pleural effusion, not elsewhere classified: Secondary | ICD-10-CM

## 2017-03-07 LAB — CBC WITH DIFFERENTIAL/PLATELET
BASOS ABS: 0 10*3/uL (ref 0–0.1)
BASOS PCT: 0 %
EOS PCT: 0 %
Eosinophils Absolute: 0 10*3/uL (ref 0–0.7)
HCT: 36.9 % (ref 35.0–47.0)
Hemoglobin: 11.8 g/dL — ABNORMAL LOW (ref 12.0–16.0)
Lymphocytes Relative: 8 %
Lymphs Abs: 1.5 10*3/uL (ref 1.0–3.6)
MCH: 27.7 pg (ref 26.0–34.0)
MCHC: 31.9 g/dL — ABNORMAL LOW (ref 32.0–36.0)
MCV: 87 fL (ref 80.0–100.0)
MONO ABS: 1.5 10*3/uL — AB (ref 0.2–0.9)
Monocytes Relative: 8 %
Neutro Abs: 15.2 10*3/uL — ABNORMAL HIGH (ref 1.4–6.5)
Neutrophils Relative %: 84 %
PLATELETS: 369 10*3/uL (ref 150–440)
RBC: 4.24 MIL/uL (ref 3.80–5.20)
RDW: 16.2 % — AB (ref 11.5–14.5)
WBC: 18.2 10*3/uL — ABNORMAL HIGH (ref 3.6–11.0)

## 2017-03-07 LAB — CULTURE, BLOOD (ROUTINE X 2)
CULTURE: NO GROWTH
Culture: NO GROWTH
Special Requests: ADEQUATE

## 2017-03-07 LAB — BASIC METABOLIC PANEL
ANION GAP: 9 (ref 5–15)
BUN: 39 mg/dL — ABNORMAL HIGH (ref 6–20)
CALCIUM: 8.7 mg/dL — AB (ref 8.9–10.3)
CO2: 23 mmol/L (ref 22–32)
Chloride: 106 mmol/L (ref 101–111)
Creatinine, Ser: 0.9 mg/dL (ref 0.44–1.00)
GLUCOSE: 174 mg/dL — AB (ref 65–99)
Potassium: 4.3 mmol/L (ref 3.5–5.1)
Sodium: 138 mmol/L (ref 135–145)

## 2017-03-07 LAB — PATHOLOGIST SMEAR REVIEW

## 2017-03-07 LAB — GLUCOSE, CAPILLARY
GLUCOSE-CAPILLARY: 157 mg/dL — AB (ref 65–99)
GLUCOSE-CAPILLARY: 179 mg/dL — AB (ref 65–99)
GLUCOSE-CAPILLARY: 187 mg/dL — AB (ref 65–99)
GLUCOSE-CAPILLARY: 214 mg/dL — AB (ref 65–99)
Glucose-Capillary: 201 mg/dL — ABNORMAL HIGH (ref 65–99)
Glucose-Capillary: 191 mg/dL — ABNORMAL HIGH (ref 65–99)
Glucose-Capillary: 222 mg/dL — ABNORMAL HIGH (ref 65–99)

## 2017-03-07 LAB — VANCOMYCIN, TROUGH: VANCOMYCIN TR: 15 ug/mL (ref 15–20)

## 2017-03-07 MED ORDER — SODIUM CHLORIDE 0.9 % IJ SOLN
Freq: Once | INTRAMUSCULAR | Status: AC
  Administered 2017-03-07: 11:00:00 via INTRAPLEURAL
  Filled 2017-03-07: qty 10

## 2017-03-07 MED ORDER — INSULIN ASPART 100 UNIT/ML ~~LOC~~ SOLN
0.0000 [IU] | Freq: Three times a day (TID) | SUBCUTANEOUS | Status: DC
Start: 2017-03-07 — End: 2017-03-11
  Administered 2017-03-07: 2 [IU] via SUBCUTANEOUS
  Administered 2017-03-07 – 2017-03-08 (×3): 3 [IU] via SUBCUTANEOUS
  Administered 2017-03-08 – 2017-03-09 (×2): 5 [IU] via SUBCUTANEOUS
  Administered 2017-03-09: 3 [IU] via SUBCUTANEOUS
  Administered 2017-03-09 – 2017-03-10 (×3): 5 [IU] via SUBCUTANEOUS
  Administered 2017-03-10: 7 [IU] via SUBCUTANEOUS
  Administered 2017-03-11: 3 [IU] via SUBCUTANEOUS
  Administered 2017-03-11: 7 [IU] via SUBCUTANEOUS
  Filled 2017-03-07 (×13): qty 1

## 2017-03-07 MED ORDER — FENTANYL CITRATE (PF) 100 MCG/2ML IJ SOLN
12.5000 ug | INTRAMUSCULAR | Status: DC | PRN
  Administered 2017-03-07 (×2): 12.5 ug via INTRAVENOUS
  Administered 2017-03-08: 25 ug via INTRAVENOUS
  Filled 2017-03-07 (×2): qty 2

## 2017-03-07 MED ORDER — INSULIN ASPART 100 UNIT/ML ~~LOC~~ SOLN
0.0000 [IU] | Freq: Every day | SUBCUTANEOUS | Status: DC
  Administered 2017-03-08 – 2017-03-09 (×2): 2 [IU] via SUBCUTANEOUS
  Administered 2017-03-10: 3 [IU] via SUBCUTANEOUS
  Filled 2017-03-07 (×3): qty 1

## 2017-03-07 MED ORDER — INSULIN ASPART 100 UNIT/ML ~~LOC~~ SOLN
4.0000 [IU] | Freq: Three times a day (TID) | SUBCUTANEOUS | Status: DC
  Administered 2017-03-09 – 2017-03-11 (×6): 4 [IU] via SUBCUTANEOUS
  Filled 2017-03-07 (×6): qty 1

## 2017-03-07 MED ORDER — KETOROLAC TROMETHAMINE 15 MG/ML IJ SOLN
15.0000 mg | Freq: Four times a day (QID) | INTRAMUSCULAR | Status: AC
  Administered 2017-03-07 – 2017-03-08 (×6): 15 mg via INTRAVENOUS
  Filled 2017-03-07 (×7): qty 1

## 2017-03-07 MED ORDER — FENTANYL CITRATE (PF) 100 MCG/2ML IJ SOLN
12.5000 ug | INTRAMUSCULAR | Status: DC | PRN
  Administered 2017-03-07: 12.5 ug via INTRAVENOUS
  Filled 2017-03-07 (×2): qty 2

## 2017-03-07 NOTE — Progress Notes (Signed)
Chaplain made a follow up visit with pt. Pt was lying on the bed and appeared down. Pt told CH that this was not a good time to visit with her. CH agreed and promised to visit with her another time. CH is available to follow up with pt as needed.   03/07/17 1400  Clinical Encounter Type  Visited With Patient  Visit Type Follow-up;Spiritual support;Other (Comment)  Referral From Chaplain  Consult/Referral To Chaplain  Spiritual Encounters  Spiritual Needs Emotional;Other (Comment)

## 2017-03-07 NOTE — Progress Notes (Signed)
Inpatient Diabetes Program Recommendations  AACE/ADA: New Consensus Statement on Inpatient Glycemic Control (2015)  Target Ranges:  Prepandial:   less than 140 mg/dL      Peak postprandial:   less than 180 mg/dL (1-2 hours)      Critically ill patients:  140 - 180 mg/dL   Lab Results  Component Value Date   GLUCAP 157 (H) 03/07/2017   HGBA1C 7.1 (H) 03/04/2017    Review of Glycemic Control  Results for Erin Russell, Erin Russell (MRN [id number]) as of 03/07/2017 08:52  Ref. Range 03/06/2017 16:12 03/06/2017 20:26 03/06/2017 23:55 03/07/2017 03:41 03/07/2017 07:51  Glucose-Capillary Latest Ref Range: 65 - 99 mg/dL 829168 (H) 562220 (H) 130214 (H) 179 (H) 157 (H)   Diabetes history: Type 2 DM Outpatient Diabetes medications: Metformin 1000 mg bid, Farxiga 10 mg q AM, Trulicity 1.5 mg weekly  Current orders for Inpatient glycemic control: Novolog 2-6 units q4h, Lantus 10 units q24h  * IV steroids q12h  Inpatient Diabetes Program Recommendations:    If the patient is eating, please transition to Novolog 0-9 units tid, add Novolog 0-5 units qhs and consider adding Novolog meal coverage 4 units tid with meals.  Susette RacerJulie Pierina Schuknecht, RN, BA, MHA, CDE Diabetes Coordinator Inpatient Diabetes Program  [phone] (Team Pager) [phone] [facility] Office) 03/07/2017 8:56 AM

## 2017-03-07 NOTE — Progress Notes (Signed)
Decreased to 50%   

## 2017-03-07 NOTE — Progress Notes (Signed)
[facility] [place] at [facility]   PATIENT NAME: Erin HemanDeborah Biondolillo    MR#:  [id number]  DATE OF BIRTH:  09/14/1955  SUBJECTIVE:  CHIEF COMPLAINT:  Patient is still reporting chest pain with deep inspirations. Was significantly short of breath and hypoxic ,requiring nonrebreather so transferred to step down 03/04/17. Changed to Vanc + zosyn. Showed complicated parapneumonic effusion, feels little better today. s/p pleural tap and tube placement om 03/06/17. On HFNC.  REVIEW OF SYSTEMS:  CONSTITUTIONAL: No fever, fatigue or weakness.  EYES: No blurred or double vision.  EARS, NOSE, AND THROAT: No tinnitus or ear pain.  RESPIRATORY: No cough, reporting exertional shortness of breath, denies wheezing or hemoptysis.  CARDIOVASCULAR: Patient is reporting chest pain with deep inspirations , denies orthopnea, edema.  GASTROINTESTINAL: No nausea, vomiting, diarrhea or abdominal pain.  GENITOURINARY: No dysuria, hematuria.  ENDOCRINE: No polyuria, nocturia,  HEMATOLOGY: No anemia, easy bruising or bleeding SKIN: No rash or lesion. MUSCULOSKELETAL: No joint pain or arthritis.   NEUROLOGIC: No tingling, numbness, weakness.  PSYCHIATRY: No anxiety or depression.   DRUG ALLERGIES:   Allergies  Allergen Reactions  . Codeine   . Latex Hives and Swelling    VITALS:  Blood pressure (!) 153/101, pulse (!) 101, temperature 98.2 F (36.8 C), temperature source Oral, resp. rate (!) 28, height 5\' 5"  (1.651 m), weight 109.1 kg (240 lb 8 oz), SpO2 92 %.  PHYSICAL EXAMINATION:  GENERAL:  61 y.o.-year-old patient lying in the bed with no acute distress.  EYES: Pupils equal, round, reactive to light and accommodation. No scleral icterus. Extraocular muscles intact.  HEENT: Head atraumatic, normocephalic. Oropharynx and nasopharynx clear.  NECK:  Supple, no jugular venous distention. No thyroid enlargement, no tenderness.  LUNGS: Moderate breath sounds on right side and  diminished breath sounds on left side, no wheezing, rales,rhonchi or crepitation. No use of accessory muscles of respiration. On HFNC. Chest tube in place. CARDIOVASCULAR: S1, S2 normal. No murmurs, rubs, or gallops.  ABDOMEN: Soft, nontender, nondistended. Bowel sounds present. No organomegaly or mass.  EXTREMITIES: No pedal edema, cyanosis, or clubbing.  NEUROLOGIC: Cranial nerves II through XII are intact. Muscle strength 5/5 in all extremities. Sensation intact. Gait not checked.  PSYCHIATRIC: The patient is alert and oriented x 3.  SKIN: No obvious rash, lesion, or ulcer.    LABORATORY PANEL:   CBC Recent Labs  Lab 03/07/17 0604  WBC 18.2*  HGB 11.8*  HCT 36.9  PLT 369   ------------------------------------------------------------------------------------------------------------------  Chemistries  Recent Labs  Lab 03/07/17 0604  NA 138  K 4.3  CL 106  CO2 23  GLUCOSE 174*  BUN 39*  CREATININE 0.90  CALCIUM 8.7*   ------------------------------------------------------------------------------------------------------------------  Cardiac Enzymes Recent Labs  Lab 03/02/17 2028  TROPONINI <0.03   ------------------------------------------------------------------------------------------------------------------  RADIOLOGY:  Dg Chest 1 View  Result Date: 03/07/2017 CLINICAL DATA:  Left pleural effusion EXAM: CHEST 1 VIEW COMPARISON:  March 05, 2017 FINDINGS: Chest tube is present on the left. Pleural effusion on the left is smaller, although there is apparent loculated effusion on the left with a focal area of fluid along the lateral mid to lower left hemithorax. There is patchy bibasilar atelectasis. There is cardiomegaly with pulmonary venous hypertension. No bone lesions evident. No pneumothorax. IMPRESSION: Loculated effusion remains on the left, although there appears to be less left-sided pleural effusion compared to 2 days prior. Chest tube present on the left.  There is bibasilar atelectatic change. Superimposed bibasilar pneumonia cannot  be excluded, particularly in the medial left base. There is evidence of pulmonary vascular congestion. No adenopathy evident. No pneumothorax appreciable. Electronically Signed   By: Bretta Bang III M.D.   On: 03/07/2017 07:11   Ct Image Guided Drainage By Percutaneous Catheter  Result Date: 03/06/2017 INDICATION: 61 year old with complex left pleural effusion and probable pneumonia. Plan for a CT-guided chest tube placement. EXAM: CT GUIDED PLACEMENT OF LEFT CHEST TUBE MEDICATIONS: None ANESTHESIA/SEDATION: 2.0 mg IV Versed 100 mcg IV Fentanyl Moderate Sedation Time:  38 minutes The patient was continuously monitored during the procedure by the interventional radiology nurse under my direct supervision. COMPLICATIONS: None immediate. TECHNIQUE: Informed written consent was obtained from the patient after a thorough discussion of the procedural risks, benefits and alternatives. All questions were addressed. Maximal Sterile Barrier Technique was utilized including caps, mask, sterile gowns, sterile gloves, sterile drape, hand hygiene and skin antiseptic. A timeout was performed prior to the initiation of the procedure. PROCEDURE: Patient was placed on her right side. CT images through the chest were obtained. Left side of the chest was prepped and draped in sterile fashion. Skin was anesthetized with 1% lidocaine. 18 gauge trocar needle was directed into the posterior pleural space with CT guidance. Amber colored fluid was aspirated. A stiff Amplatz wire was placed. The tract was dilated to accommodate a 12 [place] multipurpose drain. Approximately 40 mL of fluid was removed for analysis. Catheter sutured to skin and attached to a pleural evacuation device. FINDINGS: Left pleural effusion with consolidation in the left lower lung. Multiple pockets of pleural gas following placement of the chest drain is suggestive for a loculated  collection. IMPRESSION: CT-guided placement of left chest tube. Evidence for loculated left pleural effusion. Electronically Signed   By: Richarda Overlie M.D.   On: 03/06/2017 15:58    EKG:   Orders placed or performed during the hospital encounter of 03/02/17  . ED EKG within 10 minutes  . ED EKG within 10 minutes  . EKG 12-Lead  . EKG 12-Lead  . EKG 12-Lead  . EKG 12-Lead    ASSESSMENT AND PLAN:   * Ac respi failure with hypoxia secondary to pneumonia with moderately large loculated left-sided pleural effusion Clinically hypoxia getting worse requiring nonrebreather Appreciated help by Intensivist- Will need IR guided thoracentesis and possible decortication by CT surgeon.   CT guided chest tube placement done, fluid sent for cx.   CT surgery consult done.  *Pneumonia with pleuritic chest pain   IV rocephin+ Azithromycin changed to Zosyn and vancomycin    Bl cx negative and urine culture is contaminated    continue bronchodilator therapy with nebulizer treatments   on HFNC.   Solu-Medrol .  * Hx of smoking   But quit smoking 3 years ago   Denies COPD, will give duoneb now, try to taper oxygen.  * DM   ISs. Hold oral meds  Lantus 20 units Checked hemoglobin A1c- 7.1  * Htn    Cont home meds, hold diuretics  * Hyperlipidemia    Cont statin.  * Hx of CAD   Cont home meds bisoprolol, Cozaar, statin   Continue aspirin  All the records are reviewed and case discussed with Care Management/Social Workerr. Management plans discussed with the patient.  CODE STATUS: FC, healthcare power of attorney friend [phone]  TOTAL TIME TAKING CARE OF THIS PATIENT: 35  minutes.   POSSIBLE D/C IN 1-2  DAYS, DEPENDING ON CLINICAL CONDITION.  Note: This dictation was prepared  with Dragon dictation along with smaller phrase technology. Any transcriptional errors that result from this process are unintentional.   Altamese DillingVaibhavkumar Sharie Amorin M.D on 03/07/2017 at 1:49  PM  Between 7am to 6pm - Pager - [phone] After 6pm go to [email or web address] - password [email or web address]  [facility]  Office  [phone]  CC: Primary care physician; System, Pcp Not In

## 2017-03-07 NOTE — Progress Notes (Signed)
Patient ID: Erin Russell, female   DOB: 02/07/1956, 61 y.o.   MRN: [id number]  Chief Complaint  Patient presents with  . Flank Pain  . Chest Pain    Referred By Dr. Heber Carolinaavid Casa Reason for Referral left empyema  HPI Location, Quality, Duration, Severity, Timing, Context, Modifying Factors, Associated Signs and Symptoms.  Erin Russell is a 61 y.o. female.  She was in her usual state of health until several days ago when she was admitted to the hospital with increasing shortness of breath.  [place] and was here in [place] on business when she began experiencing some fevers and chills and shortness of breath.  She was admitted to the hospital and diagnosed with pneumonia.  An initial chest x-ray showed a possible left lower lobe infiltrate.  Over the course of a couple days she got progressively worse and required nasal CPAP.  She had a CT scan done yesterday and I discussed her care with Dr. Clearence Cheekavid Cox and Dr. Richarda OverlieAdam Henn in interventional radiology.  She underwent a percutaneous CT-guided left pleural drain.  The fluid analysis is consistent with an empyema.  Her post chest x-ray shows a loculated pleural effusion on the left side.  She states that she has been feeling better.  She states that her mother is also ill with a similar complaint down in [place].  She denied any cough or hemoptysis.  She states that she has been very active over the last year working out at least 3 times a week.  She has lost about 15 pounds intentionally.  She has a history of cardiovascular disease and is status post coronary artery stenting as well as a right carotid endarterectomy.  Currently her chest tube was hooked to 20 cm of water suction without an air leak.  It is draining some yellow clear fluid.   Past Medical History:  Diagnosis Date  . Arthritis   . Diabetes mellitus without complication (HCC)   . Heart disease   . Hypertension   . MI (myocardial infarction) [facility](HCC)     Past Surgical  History:  Procedure Laterality Date  . CARDIAC CATHETERIZATION    . CHOLECYSTECTOMY      Family History  Problem Relation Age of Onset  . CAD Mother   . CAD Father     Social History Social History   Tobacco Use  . Smoking status: Former Games developermoker  . Smokeless tobacco: Never Used  Substance Use Topics  . Alcohol use: Yes    Frequency: Never  . Drug use: No    Allergies  Allergen Reactions  . Codeine   . Latex Hives and Swelling    Current Facility-Administered Medications  Medication Dose Route Frequency Provider Last Rate Last Dose  . acetaminophen (TYLENOL) tablet 650 mg  650 mg Oral Q6H PRN Altamese DillingVachhani, Vaibhavkumar, MD   650 mg at 03/03/17 0905  . alteplase (tPA) 10mg  in NS 40mL for Dr.Verdon Ferrante (intrapleural administration/[facility])   Intrapleural Once Hulda Marinaks, Mete Purdum, MD      . amLODipine (NORVASC) tablet 10 mg  10 mg Oral Daily Altamese DillingVachhani, Vaibhavkumar, MD   10 mg at 03/06/17 [phone]  . aspirin EC tablet 81 mg  81 mg Oral Daily Altamese DillingVachhani, Vaibhavkumar, MD   81 mg at 03/06/17 [phone]  . bisoprolol (ZEBETA) tablet 10 mg  10 mg Oral Daily Altamese DillingVachhani, Vaibhavkumar, MD   10 mg at 03/06/17 1621  . budesonide (PULMICORT) nebulizer solution 0.5 mg  0.5 mg Nebulization BID Erin FullingKasa, Kurian, MD  0.5 mg at 03/07/17 0730  . cholecalciferol (VITAMIN D) tablet 2,000 Units  2,000 Units Oral Daily Altamese Dilling, MD   2,000 Units at 03/06/17 [phone]  . cyclobenzaprine (FLEXERIL) tablet 10 mg  10 mg Oral TID PRN Gouru, Aruna, MD      . dextrose 10 % infusion   Intravenous Continuous PRN Conforti, John, DO      . dextrose 10 % infusion   Intravenous Continuous PRN Conforti, John, DO      . enoxaparin (LOVENOX) injection 40 mg  40 mg Subcutaneous Q24H Gouru, Aruna, MD   40 mg at 03/06/17 2250  . fentaNYL (SUBLIMAZE) injection   Intravenous PRN Richarda Overlie, MD   25 mcg at 03/06/17 1514  . insulin aspart (novoLOG) injection 2-6 Units  2-6 Units Subcutaneous Q4H Conforti, John, DO   4 Units at 03/07/17 9604  .  insulin glargine (LANTUS) injection 10 Units  10 Units Subcutaneous Q24H Altamese Dilling, MD   10 Units at 03/07/17 0348  . insulin regular (NOVOLIN R,HUMULIN R) 100 Units in sodium chloride 0.9 % 100 mL (1 Units/mL) infusion   Intravenous Continuous Eugenie Norrie, NP   Stopped at 03/06/17 [phone]  . ipratropium-albuterol (DUONEB) 0.5-2.5 (3) MG/3ML nebulizer solution 3 mL  3 mL Nebulization Q4H PRN Erin Fulling, MD      . lidocaine (XYLOCAINE) 1 % (with pres) injection   Intradermal PRN Richarda Overlie, MD   8 mL at 03/06/17 1457  . losartan (COZAAR) tablet 100 mg  100 mg Oral Daily Altamese Dilling, MD   100 mg at 03/06/17 8119  . MEDLINE mouth rinse  15 mL Mouth Rinse BID Gouru, Aruna, MD   15 mL at 03/06/17 0939  . methylPREDNISolone sodium succinate (SOLU-MEDROL) 40 mg/mL injection 40 mg  40 mg Intravenous Q12H Erin Fulling, MD   40 mg at 03/07/17 0824  . midazolam (VERSED) injection   Intravenous PRN Richarda Overlie, MD   1 mg at 03/06/17 1508  . morphine 2 MG/ML injection 2 mg  2 mg Intravenous Q4H PRN Altamese Dilling, MD   2 mg at 03/03/17 2155  . nitroGLYCERIN (NITROSTAT) SL tablet 0.4 mg  0.4 mg Sublingual Q5 min PRN Altamese Dilling, MD      . ondansetron [facility]) injection 4 mg  4 mg Intravenous Q6H PRN Conforti, John, DO      . oxyCODONE-acetaminophen (PERCOCET/ROXICET) 5-325 MG per tablet 1 tablet  1 tablet Oral Q6H PRN Altamese Dilling, MD   1 tablet at 03/06/17 2241  . piperacillin-tazobactam (ZOSYN) IVPB 3.375 g  3.375 g Intravenous Q8H Coffee, Gerre Pebbles, RPH 12.5 mL/hr at 03/07/17 0611 3.375 g at 03/07/17 0611  . rosuvastatin (CRESTOR) tablet 20 mg  20 mg Oral Daily Altamese Dilling, MD   20 mg at 03/06/17 1705  . simethicone (MYLICON) chewable tablet 80 mg  80 mg Oral Q6H PRN Erin Fulling, MD   80 mg at 03/06/17 2005  . sodium chloride flush (NS) 0.9 % injection 3 mL  3 mL Intravenous Q12H Gouru, Aruna, MD   3 mL at 03/06/17 2250  . sodium chloride flush  (NS) 0.9 % injection 3 mL  3 mL Intravenous PRN Gouru, Aruna, MD      . vancomycin (VANCOCIN) IVPB 1000 mg/200 mL premix  1,000 mg Intravenous Once Coffee, Gerre Pebbles, RPH      . vancomycin (VANCOCIN) IVPB 750 mg/150 ml premix  750 mg Intravenous Q8H Altamese Dilling, MD   Stopped at 03/07/17 [phone]  Review of Systems A complete review of systems was asked and was negative except for the following positive findings shortness of breath which is improving.  Intentional weight loss.  Blood pressure 135/85, pulse 95, temperature 98.3 F (36.8 C), temperature source Oral, resp. rate 20, height 5\' 5"  (1.651 m), weight 240 lb 8 oz (109.1 kg), SpO2 95 %.  Physical Exam CONSTITUTIONAL:  Pleasant, well-developed, well-nourished, and in no acute distress. EYES: Pupils equal and reactive to light, Sclera non-icteric EARS, NOSE, MOUTH AND THROAT:  The oropharynx was clear.  Dentition is good repair.  Oral mucosa pink and moist. LYMPH NODES:  Lymph nodes in the neck and axillae were normal RESPIRATORY:  Lungs were clear on the right but slightly diminished at the left base.  Normal respiratory effort without pathologic use of accessory muscles of respiration CARDIOVASCULAR: Heart was regular without murmurs.  There were no carotid bruits. GI: The abdomen was soft, nontender, and nondistended. There were no palpable masses. There was no hepatosplenomegaly. There were normal bowel sounds in all quadrants. GU:  Rectal deferred.   MUSCULOSKELETAL:  Normal muscle strength and tone.  No clubbing or cyanosis.   SKIN:  There were no pathologic skin lesions.  There were no nodules on palpation. NEUROLOGIC:  Sensation is normal.  Cranial nerves are grossly intact. PSYCH:  Oriented to person, place and time.  Mood and affect are normal.  Data Reviewed CT scans and chest x-rays  I have personally reviewed the patient's imaging, laboratory findings and medical records.    Assessment    Left lower lobe  pneumonia with a left-sided empyema.    Plan    I reviewed with the patient the CT scan findings.  I discussed with her the options.  At the present time I believe that she would warrant a trial of intrapleural thrombolytics.  I discussed with her the risks including the risks of bleeding.  She understands and would like us to proceed.       Hulda Marinimothy Praise Stennett, MD 03/07/2017, 9:25 AM

## 2017-03-07 NOTE — Progress Notes (Signed)
Decreased FIO2 to 60%, sats 95% at this time, will continue to monitor

## 2017-03-07 NOTE — Progress Notes (Signed)
  [facility] Critical Care Medicine Progess Note     Name: Erin HemanDeborah Russell MRN: [id number] DOB: 03/12/1956    ADMISSION DATE:  03/02/2017   SYNOPSIS  61 year old Caucasian female with past medical history remarkable for hypertension, hyperlipidemia, diabetes mellitus, coronary artery disease, status post cardiac catheterization and stent placement, arthritis, with pneumonia, complicated pleural space with some evidence loculation.  Patient with severe hypoxic resp failure with severe Left sided pneumonia   ASSESSMENT/PLAN   Complicated pleural space, pneumonia with loculation concerning for empyema.  Chest CT shows 2 areas of loculation.  IR guided drainage, continue Zosyn and vancomycin,  Follow up CT surgery consultation for possible decortication if does not improve.   Would treat as COPD exacerbation, patient with previous extensive smoking history   SUBJECTIVE:   Patient states she is breathing better,  her shortness of breath and pleuritic chest pain has improved some and status post CT scan of the chest  S/p IR pig tail catheter placement High flow Clio at 55% down from 75%   VITAL SIGNS: Temp:  [98 F (36.7 C)-98.5 F (36.9 C)] 98.3 F (36.8 C) (12/11 0758) Pulse Rate:  [68-139] 95 (12/11 0600) Resp:  [17-33] 20 (12/11 0600) BP: (100-147)/(67-101) 135/85 (12/11 0500) SpO2:  [88 %-99 %] 95 % (12/11 0600) FiO2 (%):  [74 %-80 %] 74 % (12/10 1700)    VS: BP 135/85   Pulse 95   Temp 98.3 F (36.8 C) (Oral)   Resp 20   Ht 5\' 5"  (1.651 m)   Wt 240 lb 8 oz (109.1 kg)   SpO2 95%   BMI 40.02 kg/m    General Appearance:NO distress  Neuro:without focal findings, mental status normal. HEENT: PERRLA, EOM intact. Pulmonary: Inspiratory crackles appreciated in the left posterior lung zone Cardiovascular sinus mechanism on the monitor Abdomen: Benign, Soft, non-tender. Endocrine: No evident thyromegaly. Skin:   warm, no rashes, no ecchymosis  Extremities:  normal, no cyanosis, clubbing.  LABORATORY PANEL:   CBC Recent Labs  Lab 03/07/17 0604  WBC 18.2*  HGB 11.8*  HCT 36.9  PLT 369    Chemistries  Recent Labs  Lab 03/07/17 0604  NA 138  K 4.3  CL 106  CO2 23  GLUCOSE 174*  BUN 39*  CREATININE 0.90  CALCIUM 8.7*    Recent Labs  Lab 03/06/17 1140 03/06/17 1612 03/06/17 2026 03/06/17 2355 03/07/17 0341 03/07/17 0751  GLUCAP 199* 168* 220* 214* 179* 157*   Recent Labs  Lab 03/04/17 0746  PHART 7.40  PCO2ART 37  PO2ART 49*   No results for input(s): AST, ALT, ALKPHOS, BILITOT, ALBUMIN in the last 168 hours.  Cardiac Enzymes Recent Labs  Lab 03/02/17 2028  TROPONINI <0.03    Patient remains step down status   Lucie LeatherKurian David Maisy Newport, M.D.  [facility]  Medical Director [facility] Medical Director [facility] Cardio-Pulmonary Department

## 2017-03-07 NOTE — Progress Notes (Signed)
Pharmacy Antibiotic Note  Erin Russell is a 61 y.o. female admitted on 03/02/2017 with sepsis.  Pharmacy has been consulted for vancomycin and zosyn dosing.  Plan: Vancomycin 1250mg  IV every 12 hours.  Goal trough 15-20 mcg/mL. Zosyn 3.375g IV q8h (4 hour infusion). Vancomycin trough 12/10@0130  before 4th dose   12/10 @ 0120 VT 22 slightly supratherapeutic, dose on 12/9 given @ 1830 (4 hrs late), based on that dose trough level drawn 5 hrs too early. Will hold off vanc for now and draw a vanc random and a BMP to assess renal function @ 0500 and will reassess.  12/10 @ 0500 VR 13. Will place patient on a dose of vanc 750 mg IV q8h and will check a vanc trough 12/11 @ 0500 prior to 4th dose. Scr 0.95 >> 0.72. Renal function improving.  12/11 AM vanc level 15. Continue current regimen. Recheck as clinically indicated.  Height: 5\' 5"  (165.1 cm) Weight: 240 lb 8 oz (109.1 kg) IBW/kg (Calculated) : 57  Temp (24hrs), Avg:98.2 F (36.8 C), Min:98 F (36.7 C), Max:98.5 F (36.9 C)  Recent Labs  Lab 03/02/17 2028 03/02/17 2306 03/03/17 0219 03/03/17 0607 03/04/17 0653 03/05/17 0432 03/06/17 0120 03/06/17 0500 03/07/17 0604  WBC 16.2*  --   --  15.4* 18.3* 20.1*  --   --  18.2*  CREATININE 0.95  --   --   --  0.91  --   --  0.72 0.90  LATICACIDVEN  --  1.4 1.6  --   --   --   --   --   --   VANCOTROUGH  --   --   --   --   --   --  22*  --  15  VANCORANDOM  --   --   --   --   --   --   --  13  --     Estimated Creatinine Clearance: 80.6 mL/min (by C-G formula based on SCr of 0.9 mg/dL).    Allergies  Allergen Reactions  . Codeine   . Latex Hives and Swelling    Antimicrobials this admission: Anti-infectives (From admission, onward)   Start     Dose/Rate Route Frequency Ordered Stop   03/06/17 0645  vancomycin (VANCOCIN) IVPB 750 mg/150 ml premix     750 mg 150 mL/hr over 60 Minutes Intravenous Every 8 hours 03/06/17 0634     03/06/17 0500  vancomycin (VANCOCIN) IVPB  1000 mg/200 mL premix  Status:  Discontinued     1,000 mg 200 mL/hr over 60 Minutes Intravenous Every 12 hours 03/06/17 0209 03/06/17 0214   03/04/17 1400  vancomycin (VANCOCIN) 1,250 mg in sodium chloride 0.9 % 250 mL IVPB  Status:  Discontinued     1,250 mg 166.7 mL/hr over 90 Minutes Intravenous Every 12 hours 03/04/17 1143 03/06/17 0209   03/04/17 1000  cefTRIAXone (ROCEPHIN) 1 g in dextrose 5 % 50 mL injection  Status:  Discontinued      Intravenous Every 24 hours 03/03/17 1644 03/04/17 0837   03/04/17 1000  piperacillin-tazobactam (ZOSYN) IVPB 3.375 g     3.375 g 12.5 mL/hr over 240 Minutes Intravenous Every 8 hours 03/04/17 0954     03/04/17 1000  vancomycin (VANCOCIN) IVPB 1000 mg/200 mL premix     1,000 mg 200 mL/hr over 60 Minutes Intravenous  Once 03/04/17 0954     03/03/17 1000  cefTRIAXone (ROCEPHIN) 1 g in dextrose 5 % 50 mL IVPB - Premix  Status:  Discontinued     1 g 100 mL/hr over 30 Minutes Intravenous Every 24 hours 03/03/17 0006 03/03/17 1643   03/03/17 1000  azithromycin (ZITHROMAX) 500 mg in dextrose 5 % 250 mL IVPB  Status:  Discontinued     500 mg 250 mL/hr over 60 Minutes Intravenous Every 24 hours 03/03/17 0040 03/04/17 0837   03/03/17 0015  azithromycin (ZITHROMAX) 250 mg in dextrose 5 % 125 mL IVPB  Status:  Discontinued     250 mg 125 mL/hr over 60 Minutes Intravenous Every 24 hours 03/03/17 0006 03/03/17 0040   03/02/17 2300  cefTRIAXone (ROCEPHIN) 1 g in dextrose 5 % 50 mL IVPB - Premix     1 g 100 mL/hr over 30 Minutes Intravenous  Once 03/02/17 2250 03/03/17 0001   03/02/17 2300  azithromycin (ZITHROMAX) 500 mg in dextrose 5 % 250 mL IVPB     500 mg 250 mL/hr over 60 Minutes Intravenous  Once 03/02/17 2250 03/03/17 0026      Microbiology results: Recent Results (from the past 240 hour(s))  Blood culture (routine x 2)     Status: None   Collection Time: 03/02/17 11:06 PM  Result Value Ref Range Status   Specimen Description BLOOD LT FOREARM  Final    Special Requests   Final    BOTTLES DRAWN AEROBIC AND ANAEROBIC Blood Culture results may not be optimal due to an excessive volume of blood received in culture bottles   Culture NO GROWTH 5 DAYS  Final   Report Status 03/07/2017 FINAL  Final  Blood culture (routine x 2)     Status: None   Collection Time: 03/02/17 11:06 PM  Result Value Ref Range Status   Specimen Description BLOOD RT HAND  Final   Special Requests   Final    BOTTLES DRAWN AEROBIC AND ANAEROBIC Blood Culture adequate volume   Culture NO GROWTH 5 DAYS  Final   Report Status 03/07/2017 FINAL  Final  Urine Culture     Status: Abnormal   Collection Time: 03/02/17 11:53 PM  Result Value Ref Range Status   Specimen Description URINE, RANDOM  Final   Special Requests Normal  Final   Culture MULTIPLE SPECIES PRESENT, SUGGEST RECOLLECTION (A)  Final   Report Status 03/04/2017 FINAL  Final  Culture, expectorated sputum-assessment     Status: None   Collection Time: 03/05/17  5:50 PM  Result Value Ref Range Status   Specimen Description EXPECTORATED SPUTUM  Final   Special Requests Normal  Final   Sputum evaluation THIS SPECIMEN IS ACCEPTABLE FOR SPUTUM CULTURE  Final   Report Status 03/05/2017 FINAL  Final  Culture, respiratory (NON-Expectorated)     Status: None (Preliminary result)   Collection Time: 03/05/17  5:50 PM  Result Value Ref Range Status   Specimen Description EXPECTORATED SPUTUM  Final   Special Requests Normal Reflexed from [id number]  Final   Gram Stain   Final    FEW WBC PRESENT,BOTH PMN AND MONONUCLEAR RARE SQUAMOUS EPITHELIAL CELLS PRESENT RARE GRAM POSITIVE COCCI IN CLUSTERS Performed at [facility], [address] [place], [place] [address]    Culture PENDING  Incomplete   Report Status PENDING  Incomplete  MRSA PCR Screening     Status: None   Collection Time: 03/06/17 12:03 PM  Result Value Ref Range Status   MRSA by PCR NEGATIVE NEGATIVE Final    Comment:        The GeneXpert MRSA  Assay (  FDA approved for NASAL specimens only), is one component of a comprehensive MRSA colonization surveillance program. It is not intended to diagnose MRSA infection nor to guide or monitor treatment for MRSA infections.   Body fluid culture     Status: None (Preliminary result)   Collection Time: 03/06/17  3:28 PM  Result Value Ref Range Status   Specimen Description PLEURAL PLEURAL/PERITONEAL FLUID  Final   Special Requests Normal  Final   Gram Stain   Final    FEW WBC PRESENT, PREDOMINANTLY PMN NO ORGANISMS SEEN Performed at [facility], [address] [place], [place] [address]    Culture PENDING  Incomplete   Report Status PENDING  Incomplete    Thank you for allowing pharmacy to be a part of this patient's care.  Thomasene Ripple, PharmD, BCPS Clinical Pharmacist 03/07/2017

## 2017-03-08 ENCOUNTER — Inpatient Hospital Stay: Payer: BLUE CROSS/BLUE SHIELD

## 2017-03-08 LAB — GLUCOSE, CAPILLARY
GLUCOSE-CAPILLARY: 254 mg/dL — AB (ref 65–99)
GLUCOSE-CAPILLARY: 211 mg/dL — AB (ref 65–99)
Glucose-Capillary: 249 mg/dL — ABNORMAL HIGH (ref 65–99)
Glucose-Capillary: 229 mg/dL — ABNORMAL HIGH (ref 65–99)

## 2017-03-08 LAB — PH, BODY FLUID: pH, Body Fluid: 6.8

## 2017-03-08 LAB — CULTURE, RESPIRATORY: CULTURE: NORMAL

## 2017-03-08 LAB — CULTURE, RESPIRATORY W GRAM STAIN: Special Requests: NORMAL

## 2017-03-08 MED ORDER — RISAQUAD PO CAPS
2.0000 | ORAL_CAPSULE | Freq: Every day | ORAL | Status: DC
  Administered 2017-03-08 – 2017-03-11 (×4): 2 via ORAL
  Filled 2017-03-08 (×4): qty 2

## 2017-03-08 MED ORDER — SIMETHICONE 80 MG PO CHEW: 80.0000 mg | CHEWABLE_TABLET | Freq: Four times a day (QID) | ORAL | Status: DC | PRN

## 2017-03-08 MED ORDER — BISOPROLOL FUMARATE 10 MG PO TABS
10.0000 mg | ORAL_TABLET | Freq: Two times a day (BID) | ORAL | Status: DC
  Administered 2017-03-08 – 2017-03-11 (×6): 10 mg via ORAL
  Filled 2017-03-08 (×7): qty 1

## 2017-03-08 MED ORDER — LABETALOL HCL 5 MG/ML IV SOLN
5.0000 mg | INTRAVENOUS | Status: DC | PRN
Start: 2017-03-08 — End: 2017-03-11

## 2017-03-08 MED ORDER — BISOPROLOL FUMARATE 10 MG PO TABS
10.0000 mg | ORAL_TABLET | Freq: Two times a day (BID) | ORAL | Status: DC
  Filled 2017-03-08: qty 1

## 2017-03-08 MED ORDER — DEXTROSE 5 % IV SOLN
2.0000 g | Freq: Every day | INTRAVENOUS | Status: DC
  Administered 2017-03-08 – 2017-03-11 (×4): 2 g via INTRAVENOUS
  Filled 2017-03-08 (×4): qty 2

## 2017-03-08 NOTE — Progress Notes (Addendum)
Inpatient Diabetes Program Recommendations  AACE/ADA: New Consensus Statement on Inpatient Glycemic Control (2015)  Target Ranges:  Prepandial:   less than 140 mg/dL      Peak postprandial:   less than 180 mg/dL (1-2 hours)      Critically ill patients:  140 - 180 mg/dL   Results for Erin Russell, Allahna (MRN [id number]) as of 03/08/2017 10:41  Ref. Range 03/07/2017 07:51 03/07/2017 11:27 03/07/2017 16:26 03/07/2017 20:49 03/07/2017 23:08  Glucose-Capillary Latest Ref Range: 65 - 99 mg/dL 528157 (H) 413187 (H) 244201 (H) 191 (H) 222 (H)   Results for Erin Russell, [name] (MRN [id number]) as of 03/08/2017 10:41  Ref. Range 03/08/2017 07:20  Glucose-Capillary Latest Ref Range: 65 - 99 mg/dL 644249 (H)    Home DM Meds: Trulicity 1.5 mg Qweek       Metformin 1000 mg BID       Farxiga 10 mg daily  Current Insulin Orders: Novolog Sensitive Correction Scale/ SSI (0-9 units) TID AC + HS      Novolog 4 units TID with meals         Note patient was receiving Lantus.  Last dose Lantus was given 4am on 12/11.  CBG this AM elevated to 249 mg/dl.  Still getting Solumedrol 40 mg BID.    MD- Please consider restarting Lantus 10 units daily (0.1 units/kg dosing)     --Will follow patient during hospitalization--  Ambrose FinlandJeannine Johnston Estrellita Lasky RN, MSN, CDE Diabetes Coordinator Inpatient Glycemic Control Team Team Pager: [phone] (8a-5p)

## 2017-03-09 ENCOUNTER — Inpatient Hospital Stay: Payer: BLUE CROSS/BLUE SHIELD

## 2017-03-09 DIAGNOSIS — J181 Lobar pneumonia, unspecified organism: Secondary | ICD-10-CM

## 2017-03-09 LAB — GLUCOSE, CAPILLARY
GLUCOSE-CAPILLARY: 275 mg/dL — AB (ref 65–99)
GLUCOSE-CAPILLARY: 222 mg/dL — AB (ref 65–99)
Glucose-Capillary: 266 mg/dL — ABNORMAL HIGH (ref 65–99)
Glucose-Capillary: 248 mg/dL — ABNORMAL HIGH (ref 65–99)

## 2017-03-09 MED ORDER — PREMIER PROTEIN SHAKE
11.0000 [oz_av] | Freq: Two times a day (BID) | ORAL | Status: DC
  Administered 2017-03-09 – 2017-03-11 (×4): 11 [oz_av] via ORAL

## 2017-03-09 MED ORDER — ADULT MULTIVITAMIN W/MINERALS CH
1.0000 | ORAL_TABLET | Freq: Every day | ORAL | Status: DC
  Administered 2017-03-09 – 2017-03-11 (×3): 1 via ORAL
  Filled 2017-03-09 (×3): qty 1

## 2017-03-09 NOTE — Progress Notes (Addendum)
Initial Nutrition Assessment  DOCUMENTATION CODES:   Morbid obesity  INTERVENTION:  Recommend check Mg and P labs  Premier Protein BID, each supplement provides 160 kcal and 30 grams of protein.   MVI daily   NUTRITION DIAGNOSIS:   Increased nutrient needs related to acute illness, other (see comment)(morbid obesity ) as evidenced by increased estimated needs from protein.  GOAL:   Patient will meet greater than or equal to 90% of their needs  MONITOR:   PO intake, Supplement acceptance, Weight trends, Labs, I & O's  REASON FOR ASSESSMENT:   LOS    ASSESSMENT:   61-year-old Caucasian female with past medical history remarkable for hypertension, hyperlipidemia, diabetes mellitus, coronary artery disease, status post cardiac catheterization and stent placement.  Left side with gated empyema   Pt s/p IR chest tube placement 12/10 and intrapleural thrombolysis   Met with pt in room today. Pt reports poor appetite and oral intake for the past few weeks. Pt reports her appetite is improved today; pt eating 100% of meals. Pt reports a recent intentional 15lb weight loss r/t increased exercise and diet. There is no weight history in chart for this pt. RD will add Premier Protein to help pt meet her estimated protein needs as pt with increased needs from respiratory exertion and morbid obesity. Pt with diarrhea on risaquad. Recommend check Mg and P as pt at refeeding risk r/t poor oral intake pta and increased needs from respiratory expenditures.    Medications reviewed and include: risaquad, aspirin, vitamin D, lovenox, insulin, solu-medrol, ceftriaxone, oxycodone   Labs reviewed: BUN 39(H), Ca 8.7(L) Wbc- 18.2(H) cbgs- 164, 174 x 48hrs AIC 7.1(H)- 12/8  Nutrition-Focused physical exam completed. Findings are no fat depletion, no muscle depletion, and mild generalized edema.   Diet Order:  Diet Heart Room service appropriate? Yes; Fluid consistency: Thin  EDUCATION NEEDS:    Education needs have been addressed  Skin: Reviewed RN Assessment  Last BM:  12/13- type 7  Height:   Ht Readings from Last 1 Encounters:  03/03/17 5' 5" (1.651 m)    Weight:   Wt Readings from Last 1 Encounters:  03/03/17 240 lb 8 oz (109.1 kg)    Ideal Body Weight:  56.8 kg  BMI:  Body mass index is 40.02 kg/m.  Estimated Nutritional Needs:   Kcal:  1900-2200kcal/day   Protein:  109-120g/day   Fluid:  >1.9L/day   Casey Campbell MS, RD, LDN Pager #- [phone] After Hours Pager: [phone]  

## 2017-03-09 NOTE — Progress Notes (Signed)
[facility] [place] at [facility]   PATIENT NAME: Erin HemanDeborah Russell    MR#:  [id number]  DATE OF BIRTH:  06/11/1955  SUBJECTIVE:  CHIEF COMPLAINT:  Patient is still reporting chest pain with deep inspirations. Was significantly short of breath and hypoxic ,requiring nonrebreather so transferred to step down 03/04/17. Changed to Vanc + zosyn. Showed complicated parapneumonic effusion, feels little better today. s/p pleural tap and tube placement om 03/06/17. Also had intrapleural thrombolysis done by CT surgeon, she felt little better but extreme pain with that, but now able to taper off HFNC and on nasal canula oxygen.  REVIEW OF SYSTEMS:  CONSTITUTIONAL: No fever, fatigue or weakness.  EYES: No blurred or double vision.  EARS, NOSE, AND THROAT: No tinnitus or ear pain.  RESPIRATORY: No cough, reporting exertional shortness of breath, denies wheezing or hemoptysis.  CARDIOVASCULAR: Patient is reporting chest pain with deep inspirations , denies orthopnea, edema.  GASTROINTESTINAL: No nausea, vomiting, diarrhea or abdominal pain.  GENITOURINARY: No dysuria, hematuria.  ENDOCRINE: No polyuria, nocturia,  HEMATOLOGY: No anemia, easy bruising or bleeding SKIN: No rash or lesion. MUSCULOSKELETAL: No joint pain or arthritis.   NEUROLOGIC: No tingling, numbness, weakness.  PSYCHIATRY: No anxiety or depression.   DRUG ALLERGIES:   Allergies  Allergen Reactions  . Codeine   . Latex Hives and Swelling    VITALS:  Blood pressure (!) 145/92, pulse (!) 101, temperature 98.9 F (37.2 C), temperature source Oral, resp. rate 20, height 5\' 5"  (1.651 m), weight 109.1 kg (240 lb 8 oz), SpO2 96 %.  PHYSICAL EXAMINATION:  GENERAL:  61 y.o.-year-old patient lying in the bed with no acute distress.  EYES: Pupils equal, round, reactive to light and accommodation. No scleral icterus. Extraocular muscles intact.  HEENT: Head atraumatic, normocephalic. Oropharynx and nasopharynx  clear.  NECK:  Supple, no jugular venous distention. No thyroid enlargement, no tenderness.  LUNGS: Moderate breath sounds on right side and diminished breath sounds on left side, no wheezing, some crepitation. No use of accessory muscles of respiration. On nasal canula. Chest tube in place. CARDIOVASCULAR: S1, S2 normal. No murmurs, rubs, or gallops.  ABDOMEN: Soft, nontender, nondistended. Bowel sounds present. No organomegaly or mass.  EXTREMITIES: No pedal edema, cyanosis, or clubbing.  NEUROLOGIC: Cranial nerves II through XII are intact. Muscle strength 5/5 in all extremities. Sensation intact. Gait not checked.  PSYCHIATRIC: The patient is alert and oriented x 3.  SKIN: No obvious rash, lesion, or ulcer.    LABORATORY PANEL:   CBC Recent Labs  Lab 03/07/17 0604  WBC 18.2*  HGB 11.8*  HCT 36.9  PLT 369   ------------------------------------------------------------------------------------------------------------------  Chemistries  Recent Labs  Lab 03/07/17 0604  NA 138  K 4.3  CL 106  CO2 23  GLUCOSE 174*  BUN 39*  CREATININE 0.90  CALCIUM 8.7*   ------------------------------------------------------------------------------------------------------------------  Cardiac Enzymes Recent Labs  Lab 03/02/17 2028  TROPONINI <0.03   ------------------------------------------------------------------------------------------------------------------  RADIOLOGY:  Dg Chest Port 1 View  Result Date: 03/08/2017 CLINICAL DATA:  Normal loculated empyema.  Status post drainage. EXAM: PORTABLE CHEST 1 VIEW COMPARISON:  One-view chest x-ray 03/07/2017 and CT of chest 03/04/2017. FINDINGS: The heart is enlarged. A left pleural drain is in place. The left pleural collection has decreased. Left basilar airspace disease remains. The right lung is clear. The lung volumes are low. IMPRESSION: 1. Decreasing left pleural effusion with a left-sided chest tube in place. 2. Residual left  lower lobe airspace disease representing  infection or atelectasis. Electronically Signed   By: Marin Robertshristopher  Mattern M.D.   On: 03/08/2017 13:18    EKG:   Orders placed or performed during the hospital encounter of 03/02/17  . ED EKG within 10 minutes  . ED EKG within 10 minutes  . EKG 12-Lead  . EKG 12-Lead  . EKG 12-Lead  . EKG 12-Lead    ASSESSMENT AND PLAN:   * Ac respi failure with hypoxia secondary to pneumonia with moderately large loculated left-sided pleural effusion Clinically hypoxia getting worse requiring nonrebreather Appreciated help by Intensivist- Will need IR guided thoracentesis and possible decortication by CT surgeon.   CT guided chest tube placement done, fluid sent for cx.   CT surgery consult appreciated, intra pleural thrombolytics, seems helping.  * Pneumonia with pleuritic chest pain   IV rocephin+ Azithromycin changed to Zosyn and vancomycin    Bl cx negative and urine culture is contaminated    continue bronchodilator therapy with nebulizer treatments   Was on HFNC. Now on nasal canula.   Solu-Medrol .  * Hx of smoking   But quit smoking 3 years ago   Denies COPD, will give duoneb now, try to taper oxygen.  * DM   ISs. Hold oral meds  Lantus 20 units Checked hemoglobin A1c- 7.1  * Htn    Cont home meds, hold diuretics  * Hyperlipidemia    Cont statin.  * Hx of CAD   Cont home meds bisoprolol, Cozaar, statin   Continue aspirin  All the records are reviewed and case discussed with Care Management/Social Workerr. Management plans discussed with the patient.  CODE STATUS: FC, healthcare power of attorney friend Aaren-[phone]  TOTAL TIME TAKING CARE OF THIS PATIENT: 35  minutes.   POSSIBLE D/C IN 1-2  DAYS, DEPENDING ON CLINICAL CONDITION.  Note: This dictation was prepared with Dragon dictation along with smaller phrase technology. Any transcriptional errors that result from this process are unintentional.   Altamese DillingVaibhavkumar  Audree Schrecengost M.D on 03/09/2017 at 8:14 AM  Between 7am to 6pm - Pager - [phone] After 6pm go to [email or web address] - password [email or web address]  [facility] [place]  Office  [phone]  CC: Primary care physician; System, Pcp Not In

## 2017-03-09 NOTE — Progress Notes (Signed)
  Patient ID: Erin HemanDeborah Russell, female   DOB: 01/29/1956, 61 y.o.   MRN: [id number]  HISTORY: She had a quiet night.  She states she slept well.  She is not as short of breath that she was when she first came into the hospital.  Her pain is much improved as well.   Vitals:   03/09/17 1038 03/09/17 1052  BP: (!) 153/82   Pulse: 86   Resp: 18   Temp: 97.7 F (36.5 C)   SpO2: 96% 96%     EXAM:    Resp: Lungs are clear bilaterally but perhaps slightly diminished at the left base.  No respiratory distress, normal effort. Heart:  Regular without murmurs Abd:  Abdomen is soft, non distended and non tender. No masses are palpable.  There is no rebound and no guarding.  Neurological: Alert and oriented to person, place, and time. Coordination normal.  Skin: Skin is warm and dry. No rash noted. No diaphoretic. No erythema. No pallor.  Psychiatric: Normal mood and affect. Normal behavior. Judgment and thought content normal.    ASSESSMENT: There is minimal drainage from the chest tube.  There is minimal fluid present on the chest x-ray.   PLAN:   I had a long discussion with her yesterday and again today about using TPA again.  She is adamant that she does not want that.  I told her the only way to make an assessment of her pleural space was with a CT scan.  We will go ahead and order that.  I do not want to remove her tube if there still is a large pleural effusion present.    Hulda Marinimothy Zamari Vea, MD

## 2017-03-09 NOTE — Progress Notes (Signed)
  [facility]     Name: Erin HemanDeborah Hust MRN: [id number] DOB: 07/14/1955    ADMISSION DATE:  03/02/2017   SYNOPSIS  61 year old Caucasian female with past medical history remarkable for hypertension, hyperlipidemia, diabetes mellitus, coronary artery disease, status post cardiac catheterization and stent placement.  Left side with gated empyema   ASSESSMENT/PLAN   Pneumonia left empyema status post CT-guided placement of chest tube. CT surgery following, patient is receiving intrapleural TPA.  -Continue management as per CT surgery recommendations.  --Pulmonary service will sign off for now, please call if there are any further questions or concerns.    SUBJECTIVE:  Patient states she is breathing better,  her shortness of breath and pleuritic chest pain has improved some and status post CT scan of the chest  S/p IR pig tail catheter placement; now on nasal cannula.   Pleural fluid results reviewed, neutropenic, exudative. Culture pending.  Images personally reviewed, CXR shows resolving pleural effusion of left side with presence of left pigtail catheter.    VITAL SIGNS: Temp:  [97.4 F (36.3 C)-98.9 F (37.2 C)] 98.9 F (37.2 C) (12/13 0516) Pulse Rate:  [36-129] 101 (12/13 0518) Resp:  [18-28] 20 (12/13 0516) BP: (138-169)/(84-103) 145/92 (12/13 0516) SpO2:  [93 %-97 %] 96 % (12/13 0729)    VS: BP (!) 145/92 (BP Location: Right Arm)   Pulse (!) 101   Temp 98.9 F (37.2 C) (Oral)   Resp 20   Ht 5\' 5"  (1.651 m)   Wt 240 lb 8 oz (109.1 kg)   SpO2 96%   BMI 40.02 kg/m    General Appearance:NO distress  Neuro:without focal findings, mental status normal. HEENT: PERRLA, EOM intact. Pulmonary: decreased air entry left base.  Cardiovascular sinus mechanism on the monitor Abdomen: Benign, Soft, non-tender. Endocrine: No evident thyromegaly. Skin:   warm, no rashes, no ecchymosis  Extremities: normal, no cyanosis,  clubbing.  LABORATORY PANEL:   CBC Recent Labs  Lab 03/07/17 0604  WBC 18.2*  HGB 11.8*  HCT 36.9  PLT 369    Chemistries  Recent Labs  Lab 03/07/17 0604  NA 138  K 4.3  CL 106  CO2 23  GLUCOSE 174*  BUN 39*  CREATININE 0.90  CALCIUM 8.7*    Recent Labs  Lab 03/07/17 2308 03/08/17 0720 03/08/17 1108 03/08/17 1623 03/08/17 2201 03/09/17 0723  GLUCAP 222* 249* 254* 211* 229* 266*   Recent Labs  Lab 03/04/17 0746  PHART 7.40  PCO2ART 37  PO2ART 49*   No results for input(s): AST, ALT, ALKPHOS, BILITOT, ALBUMIN in the last 168 hours.  Cardiac Enzymes Recent Labs  Lab 03/02/17 2028  TROPONINI <0.03    Deep Nicholos Johnsamachandran, MD.   Board Certified in Internal Medicine, Pulmonary Medicine, Critical Care Medicine, and Sleep Medicine.  [facility] Office Number: [phone] Pager: [phone]  Santiago Gladavid Kasa, M.D.  Billy Fischeravid Simonds, M.D

## 2017-03-10 ENCOUNTER — Inpatient Hospital Stay: Payer: BLUE CROSS/BLUE SHIELD

## 2017-03-10 LAB — CBC
HCT: 40.1 % (ref 35.0–47.0)
HEMOGLOBIN: 12.9 g/dL (ref 12.0–16.0)
MCH: 27.9 pg (ref 26.0–34.0)
MCHC: 32.3 g/dL (ref 32.0–36.0)
MCV: 86.4 fL (ref 80.0–100.0)
PLATELETS: 540 10*3/uL — AB (ref 150–440)
RBC: 4.64 MIL/uL (ref 3.80–5.20)
RDW: 15.8 % — AB (ref 11.5–14.5)
WBC: 19 10*3/uL — ABNORMAL HIGH (ref 3.6–11.0)

## 2017-03-10 LAB — BASIC METABOLIC PANEL
Anion gap: 9 (ref 5–15)
BUN: 45 mg/dL — AB (ref 6–20)
CALCIUM: 8.6 mg/dL — AB (ref 8.9–10.3)
CHLORIDE: 101 mmol/L (ref 101–111)
CO2: 24 mmol/L (ref 22–32)
CREATININE: 0.58 mg/dL (ref 0.44–1.00)
GFR calc non Af Amer: >60 mL/min (ref >60)
Glucose, Bld: 295 mg/dL — ABNORMAL HIGH (ref 65–99)
Potassium: 4.4 mmol/L (ref 3.5–5.1)
SODIUM: 134 mmol/L — AB (ref 135–145)

## 2017-03-10 LAB — GLUCOSE, CAPILLARY
GLUCOSE-CAPILLARY: 286 mg/dL — AB (ref 65–99)
GLUCOSE-CAPILLARY: 263 mg/dL — AB (ref 65–99)
Glucose-Capillary: 332 mg/dL — ABNORMAL HIGH (ref 65–99)
Glucose-Capillary: 253 mg/dL — ABNORMAL HIGH (ref 65–99)

## 2017-03-10 LAB — BODY FLUID CULTURE
Culture: NO GROWTH
Special Requests: NORMAL

## 2017-03-10 LAB — MAGNESIUM: MAGNESIUM: 2.2 mg/dL (ref 1.7–2.4)

## 2017-03-10 LAB — PHOSPHORUS: PHOSPHORUS: 3.7 mg/dL (ref 2.5–4.6)

## 2017-03-10 MED ORDER — PANTOPRAZOLE SODIUM 40 MG PO TBEC
40.0000 mg | DELAYED_RELEASE_TABLET | Freq: Two times a day (BID) | ORAL | Status: DC
  Administered 2017-03-10 – 2017-03-11 (×2): 40 mg via ORAL
  Filled 2017-03-10 (×3): qty 1

## 2017-03-10 NOTE — Care Management (Signed)
Patient request for scripts to be escribed to [facility] on [address]

## 2017-03-10 NOTE — Care Management (Signed)
Confirmed with bedside RN that patient is 95% on RA with exertion.

## 2017-03-10 NOTE — Progress Notes (Signed)
[facility] [place] [facility]   PATIENT NAME: Erin HemanDeborah Russell    MR#:  [id number]  DATE OF BIRTH:  01/05/1956  SUBJECTIVE:  CHIEF COMPLAINT:  Patient is still reporting chest pain with deep inspirations. Was significantly short of breath and hypoxic ,requiring nonrebreather so transferred to step down 03/04/17. Changed to Vanc + zosyn. Showed complicated parapneumonic effusion, feels little better today. s/p pleural tap and tube placement om 03/06/17. Also had intrapleural thrombolysis done by CT surgeon, she felt little better but extreme pain with that, but now able to taper off HFNC and on nasal canula oxygen.  feeling much better, less drainage from tube. No pain.  Chest tube came out today- on walking her HR went up to 150, so decided to monitor in hospital.  REVIEW OF SYSTEMS:  CONSTITUTIONAL: No fever, fatigue or weakness.  EYES: No blurred or double vision.  EARS, NOSE, AND THROAT: No tinnitus or ear pain.  RESPIRATORY: No cough, reporting exertional shortness of breath, denies wheezing or hemoptysis.  CARDIOVASCULAR: Patient is reporting chest pain with deep inspirations , denies orthopnea, edema.  GASTROINTESTINAL: No nausea, vomiting, diarrhea or abdominal pain.  GENITOURINARY: No dysuria, hematuria.  ENDOCRINE: No polyuria, nocturia,  HEMATOLOGY: No anemia, easy bruising or bleeding SKIN: No rash or lesion. MUSCULOSKELETAL: No joint pain or arthritis.   NEUROLOGIC: No tingling, numbness, weakness.  PSYCHIATRY: No anxiety or depression.   DRUG ALLERGIES:   Allergies  Allergen Reactions  . Codeine   . Latex Hives and Swelling    VITALS:  Blood pressure (!) 159/85, pulse 86, temperature (!) 97.5 F (36.4 C), temperature source Oral, resp. rate 20, height 5\' 5"  (1.651 m), weight 109.1 kg (240 lb 8 oz), SpO2 94 %.  PHYSICAL EXAMINATION:  GENERAL:  61 y.o.-year-old patient lying in the bed with no acute distress.  EYES: Pupils equal, round,  reactive to light and accommodation. No scleral icterus. Extraocular muscles intact.  HEENT: Head atraumatic, normocephalic. Oropharynx and nasopharynx clear.  NECK:  Supple, no jugular venous distention. No thyroid enlargement, no tenderness.  LUNGS: Moderate breath sounds on both side, no wheezing, some crepitation. No use of accessory muscles of respiration. On nasal canula. Chest tube in place. CARDIOVASCULAR: S1, S2 normal. No murmurs, rubs, or gallops.  ABDOMEN: Soft, nontender, nondistended. Bowel sounds present. No organomegaly or mass.  EXTREMITIES: No pedal edema, cyanosis, or clubbing.  NEUROLOGIC: Cranial nerves II through XII are intact. Muscle strength 5/5 in all extremities. Sensation intact. Gait not checked.  PSYCHIATRIC: The patient is alert and oriented x 3.  SKIN: No obvious rash, lesion, or ulcer.    LABORATORY PANEL:   CBC Recent Labs  Lab 03/10/17 0404  WBC 19.0*  HGB 12.9  HCT 40.1  PLT 540*   ------------------------------------------------------------------------------------------------------------------  Chemistries  Recent Labs  Lab 03/10/17 0404  NA 134*  K 4.4  CL 101  CO2 24  GLUCOSE 295*  BUN 45*  CREATININE 0.58  CALCIUM 8.6*  MG 2.2   ------------------------------------------------------------------------------------------------------------------  Cardiac Enzymes No results for input(s): TROPONINI in the last 168 hours. ------------------------------------------------------------------------------------------------------------------  RADIOLOGY:  Dg Chest 2 View  Result Date: 03/10/2017 CLINICAL DATA:  Follow-up pneumonia EXAM: CHEST  2 VIEW COMPARISON:  03/09/2017 CT of the chest FINDINGS: Previously seen left-sided chest tube is been removed. No new pneumothorax is identified. Bibasilar atelectatic changes are again seen. No sizable effusion is noted. No acute bony abnormality is seen. IMPRESSION: No pneumothorax following chest  tube removal. Stable  bibasilar changes. Electronically Signed   By: Alcide CleverMark  Lukens M.D.   On: 03/10/2017 08:51   Ct Chest Wo Contrast  Result Date: 03/09/2017 CLINICAL DATA:  Followup empyema. EXAM: CT CHEST WITHOUT CONTRAST TECHNIQUE: Multidetector CT imaging of the chest was performed following the standard protocol without IV contrast. COMPARISON:  Chest CT 03/04/2017 FINDINGS: Cardiovascular: The heart is mildly enlarged but stable. Stable tortuosity and calcification of the thoracic aorta. Stable coronary artery calcifications and coronary artery stent. Mediastinum/Nodes: No mediastinal or hilar mass or lymphadenopathy. Small scattered lymph nodes are stable. Esophagus is grossly normal. Lungs/Pleura: Cope loop type left-sided pleural drainage catheter in place with significant reduction in loculated left pleural effusion. There is also a very small right pleural effusion. Moderate areas of bibasilar atelectasis. No pulmonary edema. No worrisome pulmonary lesions. Upper Abdomen: No significant upper abdominal findings. Stable bilateral adrenal gland adenomas. Musculoskeletal: No significant bony findings. IMPRESSION: 1. Left-sided pleural drainage catheter in place with small residual pleural effusion at the left lung base. 2. Bibasilar atelectasis. Aortic Atherosclerosis (ICD10-I70.0). Electronically Signed   By: Rudie MeyerP.  Gallerani M.D.   On: 03/09/2017 12:37    EKG:   Orders placed or performed during the hospital encounter of 03/02/17  . ED EKG within 10 minutes  . ED EKG within 10 minutes  . EKG 12-Lead  . EKG 12-Lead  . EKG 12-Lead  . EKG 12-Lead    ASSESSMENT AND PLAN:   * Ac respi failure with hypoxia secondary to pneumonia with moderately large loculated left-sided pleural effusion Clinically hypoxia getting worse requiring nonrebreather Appreciated help by Intensivist-  IR guided thoracentesis and possible decortication by CT surgeon.   CT guided chest tube placement done, fluid sent  for cx.   CT surgery consult appreciated, intra pleural thrombolytics, seems helping.   Now after improvement, moved to floor, chest tube removed 03/10/17  on walking in hallway- HR went to 150, no hypoxia.   Monitor today in hospital- likely d/c tomorrow.  * Pneumonia with pleuritic chest pain   IV rocephin+ Azithromycin changed to Zosyn and vancomycin    Bl cx negative and urine culture is contaminated    continue bronchodilator therapy with nebulizer treatments   Was on HFNC. Now on nasal canula. On room air.   Solu-Medrol.- may taper on d/c.  * Hx of smoking   But quit smoking 3 years ago   Denies COPD, will give duoneb now, try to taper oxygen.  * DM   ISs. Hold oral meds  Lantus 20 units Checked hemoglobin A1c- 7.1  * Htn    Cont home meds, hold diuretics  * Hyperlipidemia    Cont statin.  * Hx of CAD   Cont home meds bisoprolol, Cozaar, statin   Continue aspirin  All the records are reviewed and case discussed with Care Management/Social Workerr. Management plans discussed with the patient.  CODE STATUS: FC, healthcare power of attorney friend Aaren-[phone]  TOTAL TIME TAKING CARE OF THIS PATIENT: 35  minutes.   POSSIBLE D/C IN 1 DAYS, DEPENDING ON CLINICAL CONDITION.  Note: This dictation was prepared with Dragon dictation along with smaller phrase technology. Any transcriptional errors that result from this process are unintentional.   Altamese DillingVaibhavkumar Yailene Badia M.D on 03/10/2017 at 4:05 PM  Between 7am to 6pm - Pager - [phone] After 6pm go to [email or web address] - password [email or web address]  [facility] [place] [facility]  Office  [phone]  CC: Primary care physician; System, Pcp Not In

## 2017-03-10 NOTE — Progress Notes (Signed)
Hospitalist notified of 3 and 6 beat runs of wide qrs complexes per central monitoring. No new orders. Will continue to monitor.

## 2017-03-10 NOTE — Progress Notes (Addendum)
Inpatient Diabetes Program Recommendations  AACE/ADA: New Consensus Statement on Inpatient Glycemic Control (2015)  Target Ranges:  Prepandial:   less than 140 mg/dL      Peak postprandial:   less than 180 mg/dL (1-2 hours)      Critically ill patients:  140 - 180 mg/dL   Results for Erin Russell, Erin Russell (MRN [id number]) as of 03/10/2017 08:52  Ref. Range 03/09/2017 07:23 03/09/2017 11:53 03/09/2017 16:55 03/09/2017 21:17  Glucose-Capillary Latest Ref Range: 65 - 99 mg/dL 147266 (H) 829248 (H) 562275 (H) 222 (H)   Results for Erin Russell, Erin Russell (MRN [id number]) as of 03/10/2017 08:52  Ref. Range 03/10/2017 07:25  Glucose-Capillary Latest Ref Range: 65 - 99 mg/dL 696332 (H)    Home DM Meds: Trulicity 1.5 mg Qweek                             Metformin 1000 mg BID                             Farxiga 10 mg daily  Current Insulin Orders: Novolog Sensitive Correction Scale/ SSI (0-9 units) TID AC + HS                                       Novolog 4 units TID with meals         Note patient was receiving Lantus several days ago in the hospital.  Last dose Lantus was given 4am on 12/11.  CBG this AM elevated to 332 mg/dl.  Still getting Solumedrol 40 mg BID.     MD- Please consider restarting Lantus 15 units daily (0.15 units/kg dosing) while patient getting IV steroids  May also want to increase Novolog Meal Coverage to: Novolog 6 units TID with meals (hold if pt eats <50% of meal)        --Will follow patient during hospitalization--  Ambrose FinlandJeannine Johnston Faraz Ponciano RN, MSN, CDE Diabetes Coordinator Inpatient Glycemic Control Team Team Pager: [phone] (8a-5p)

## 2017-03-10 NOTE — Progress Notes (Signed)
[facility] - [place] at [facility]   PATIENT NAME: Erin Russell    MR#:  [id number]  DATE OF BIRTH:  Dec 31, 1955  SUBJECTIVE:  CHIEF COMPLAINT:  Patient is still reporting chest pain with deep inspirations. Was significantly short of breath and hypoxic ,requiring nonrebreather so transferred to step down 03/04/17. Changed to Vanc + zosyn. Showed complicated parapneumonic effusion, feels little better today. s/p pleural tap and tube placement om 03/06/17. Also had intrapleural thrombolysis done by CT surgeon, she felt little better but extreme pain with that, but now able to taper off HFNC and on nasal canula oxygen.  feeling much better, less drainage from tube. No pain.  REVIEW OF SYSTEMS:  CONSTITUTIONAL: No fever, fatigue or weakness.  EYES: No blurred or double vision.  EARS, NOSE, AND THROAT: No tinnitus or ear pain.  RESPIRATORY: No cough, reporting exertional shortness of breath, denies wheezing or hemoptysis.  CARDIOVASCULAR: Patient is reporting chest pain with deep inspirations , denies orthopnea, edema.  GASTROINTESTINAL: No nausea, vomiting, diarrhea or abdominal pain.  GENITOURINARY: No dysuria, hematuria.  ENDOCRINE: No polyuria, nocturia,  HEMATOLOGY: No anemia, easy bruising or bleeding SKIN: No rash or lesion. MUSCULOSKELETAL: No joint pain or arthritis.   NEUROLOGIC: No tingling, numbness, weakness.  PSYCHIATRY: No anxiety or depression.   DRUG ALLERGIES:   Allergies  Allergen Reactions  . Codeine   . Latex Hives and Swelling    VITALS:  Blood pressure 134/85, pulse 90, temperature 98.6 F (37 C), resp. rate 20, height 5\' 5"  (1.651 m), weight 109.1 kg (240 lb 8 oz), SpO2 94 %.  PHYSICAL EXAMINATION:  GENERAL:  61 y.o.-year-old patient lying in the bed with no acute distress.  EYES: Pupils equal, round, reactive to light and accommodation. No scleral icterus. Extraocular muscles intact.  HEENT: Head atraumatic, normocephalic.  Oropharynx and nasopharynx clear.  NECK:  Supple, no jugular venous distention. No thyroid enlargement, no tenderness.  LUNGS: Moderate breath sounds on both side, no wheezing, some crepitation. No use of accessory muscles of respiration. On nasal canula. Chest tube in place. CARDIOVASCULAR: S1, S2 normal. No murmurs, rubs, or gallops.  ABDOMEN: Soft, nontender, nondistended. Bowel sounds present. No organomegaly or mass.  EXTREMITIES: No pedal edema, cyanosis, or clubbing.  NEUROLOGIC: Cranial nerves II through XII are intact. Muscle strength 5/5 in all extremities. Sensation intact. Gait not checked.  PSYCHIATRIC: The patient is alert and oriented x 3.  SKIN: No obvious rash, lesion, or ulcer.    LABORATORY PANEL:   CBC Recent Labs  Lab 03/10/17 0404  WBC 19.0*  HGB 12.9  HCT 40.1  PLT 540*   ------------------------------------------------------------------------------------------------------------------  Chemistries  Recent Labs  Lab 03/10/17 0404  NA 134*  K 4.4  CL 101  CO2 24  GLUCOSE 295*  BUN 45*  CREATININE 0.58  CALCIUM 8.6*  MG 2.2   ------------------------------------------------------------------------------------------------------------------  Cardiac Enzymes No results for input(s): TROPONINI in the last 168 hours. ------------------------------------------------------------------------------------------------------------------  RADIOLOGY:  Dg Chest 2 View  Result Date: 03/10/2017 CLINICAL DATA:  Follow-up pneumonia EXAM: CHEST  2 VIEW COMPARISON:  03/09/2017 CT of the chest FINDINGS: Previously seen left-sided chest tube is been removed. No new pneumothorax is identified. Bibasilar atelectatic changes are again seen. No sizable effusion is noted. No acute bony abnormality is seen. IMPRESSION: No pneumothorax following chest tube removal. Stable bibasilar changes. Electronically Signed   By: Alcide Clever M.D.   On: 03/10/2017 08:51   Ct Chest Wo  Contrast  Result Date:  03/09/2017 CLINICAL DATA:  Followup empyema. EXAM: CT CHEST WITHOUT CONTRAST TECHNIQUE: Multidetector CT imaging of the chest was performed following the standard protocol without IV contrast. COMPARISON:  Chest CT 03/04/2017 FINDINGS: Cardiovascular: The heart is mildly enlarged but stable. Stable tortuosity and calcification of the thoracic aorta. Stable coronary artery calcifications and coronary artery stent. Mediastinum/Nodes: No mediastinal or hilar mass or lymphadenopathy. Small scattered lymph nodes are stable. Esophagus is grossly normal. Lungs/Pleura: Cope loop type left-sided pleural drainage catheter in place with significant reduction in loculated left pleural effusion. There is also a very small right pleural effusion. Moderate areas of bibasilar atelectasis. No pulmonary edema. No worrisome pulmonary lesions. Upper Abdomen: No significant upper abdominal findings. Stable bilateral adrenal gland adenomas. Musculoskeletal: No significant bony findings. IMPRESSION: 1. Left-sided pleural drainage catheter in place with small residual pleural effusion at the left lung base. 2. Bibasilar atelectasis. Aortic Atherosclerosis (ICD10-I70.0). Electronically Signed   By: Rudie MeyerP.  Gallerani M.D.   On: 03/09/2017 12:37   Dg Chest Port 1 View  Result Date: 03/08/2017 CLINICAL DATA:  Normal loculated empyema.  Status post drainage. EXAM: PORTABLE CHEST 1 VIEW COMPARISON:  One-view chest x-ray 03/07/2017 and CT of chest 03/04/2017. FINDINGS: The heart is enlarged. A left pleural drain is in place. The left pleural collection has decreased. Left basilar airspace disease remains. The right lung is clear. The lung volumes are low. IMPRESSION: 1. Decreasing left pleural effusion with a left-sided chest tube in place. 2. Residual left lower lobe airspace disease representing infection or atelectasis. Electronically Signed   By: Marin Robertshristopher  Mattern M.D.   On: 03/08/2017 13:18    EKG:   Orders  placed or performed during the hospital encounter of 03/02/17  . ED EKG within 10 minutes  . ED EKG within 10 minutes  . EKG 12-Lead  . EKG 12-Lead  . EKG 12-Lead  . EKG 12-Lead    ASSESSMENT AND PLAN:   * Ac respi failure with hypoxia secondary to pneumonia with moderately large loculated left-sided pleural effusion Clinically hypoxia getting worse requiring nonrebreather Appreciated help by Intensivist-  IR guided thoracentesis and possible decortication by CT surgeon.   CT guided chest tube placement done, fluid sent for cx.   CT surgery consult appreciated, intra pleural thrombolytics, seems helping.   Now after improvement, moved to floor, still have chest tube- further decision by CT surgeon.  * Pneumonia with pleuritic chest pain   IV rocephin+ Azithromycin changed to Zosyn and vancomycin    Bl cx negative and urine culture is contaminated    continue bronchodilator therapy with nebulizer treatments   Was on HFNC. Now on nasal canula.   Solu-Medrol .  * Hx of smoking   But quit smoking 3 years ago   Denies COPD, will give duoneb now, try to taper oxygen.  * DM   ISs. Hold oral meds  Lantus 20 units Checked hemoglobin A1c- 7.1  * Htn    Cont home meds, hold diuretics  * Hyperlipidemia    Cont statin.  * Hx of CAD   Cont home meds bisoprolol, Cozaar, statin   Continue aspirin  All the records are reviewed and case discussed with Care Management/Social Workerr. Management plans discussed with the patient.  CODE STATUS: FC, healthcare power of attorney friend Aaren-[phone]  TOTAL TIME TAKING CARE OF THIS PATIENT: 35  minutes.   POSSIBLE D/C IN 1-2  DAYS, DEPENDING ON CLINICAL CONDITION.  Note: This dictation was prepared with Dragon dictation along with smaller  Lobbyistphrase technology. Any transcriptional errors that result from this process are unintentional.   Altamese DillingVaibhavkumar Alicen Donalson M.D on 03/10/2017 at 9:07 AM  Between 7am to 6pm - Pager -  [phone] After 6pm go to [email or web address] - password [email or web address]  [facility]  Office  [phone]  CC: Primary care physician; System, Pcp Not In

## 2017-03-10 NOTE — Progress Notes (Signed)
  Patient ID: Erin HemanDeborah Russell, female   DOB: 11/05/1955, 61 y.o.   MRN: [id number]  HISTORY: No problems overnight.  Feels much better.  Not short of breath.   Vitals:   03/09/17 2120 03/10/17 0417  BP: 126/77 134/85  Pulse: 84 90  Resp: 20 20  Temp: (!) 97.4 F (36.3 C) 98.6 F (37 C)  SpO2: 94% 94%     EXAM:    Resp: Lungs are clear bilaterally.  No respiratory distress, normal effort. Heart:  Regular without murmurs Abd:  Abdomen is soft, non distended and non tender. No masses are palpable.  There is no rebound and no guarding.  Neurological: Alert and oriented to person, place, and time. Coordination normal.  Skin: Skin is warm and dry. No rash noted. No diaphoretic. No erythema. No pallor.  Psychiatric: Normal mood and affect. Normal behavior. Judgment and thought content normal.   No air leak seen.  Chest tube drainage is zero last 24 hours.    ASSESSMENT: CT scan shows very little fluid.  Cultures negative.  Chest tube drainage is 0 cc.  She has refused any more intrapleural TPA.     PLAN:   I have removed her chest tube.  I gave her my business card.  She wants to go back to [place].  I counseled her about flying with a pneumothorax.  She understands she is at risk for complications.  I encouraged her to gather all her films prior to leaving [place].  She will do so.  She has a pulmonologist she can followup with in [place].  Will check CXRay after chest tube removal.    Hulda Marinimothy Medhansh Brinkmeier, MD

## 2017-03-11 LAB — GLUCOSE, CAPILLARY
Glucose-Capillary: 303 mg/dL — ABNORMAL HIGH (ref 65–99)
Glucose-Capillary: 210 mg/dL — ABNORMAL HIGH (ref 65–99)

## 2017-03-11 MED ORDER — PREDNISONE 10 MG PO TABS: ORAL_TABLET | ORAL | 0 refills | Status: AC

## 2017-03-11 MED ORDER — LEVOFLOXACIN 500 MG PO TABS: 500.0000 mg | ORAL_TABLET | Freq: Every day | ORAL | 0 refills | Status: DC

## 2017-03-11 MED ORDER — PREDNISONE 10 MG PO TABS: ORAL_TABLET | ORAL | 0 refills | Status: DC

## 2017-03-11 MED ORDER — LEVOFLOXACIN 500 MG PO TABS: 500.0000 mg | ORAL_TABLET | Freq: Every day | ORAL | 0 refills | Status: AC

## 2017-03-11 NOTE — Care Management (Signed)
No discharge needs identified by members of the care team 

## 2017-03-11 NOTE — Discharge Summary (Signed)
[facility] - [place] at [facility]   PATIENT NAME: Erin HemanDeborah Russell    MR#:  [id number]  DATE OF BIRTH:  04/29/1955  DATE OF ADMISSION:  03/02/2017 ADMITTING PHYSICIAN: Altamese DillingVaibhavkumar Vachhani, MD  DATE OF DISCHARGE: 03/11/2017  PRIMARY CARE PHYSICIAN: System, Pcp Not In    ADMISSION DIAGNOSIS:  Acute respiratory failure with hypoxia (HCC) [J96.01] Community acquired pneumonia of left lower lobe of lung (HCC) [J18.1]  DISCHARGE DIAGNOSIS:  Active Problems:   Pneumonia   Pleural effusion on left   SECONDARY DIAGNOSIS:   Past Medical History:  Diagnosis Date  . Arthritis   . Diabetes mellitus without complication (HCC)   . Heart disease   . Hypertension   . MI (myocardial infarction) [facility](HCC)     HOSPITAL COURSE:   61 year old female with past medical history of hypertension, history of coronary artery disease status post an placement, diabetes who presented to the hospital due to acute respiratory failure with hypoxia.  1. Acute respiratory failure with hypoxia-secondary to pneumonia with parapneumonic effusion. -Initially patient was admitted to the hospital and placed on broad-spectrum IV antibiotics. Given her large loculated pleural effusion of the left a CT surgery consult was obtained. -Patient underwent interventional radiology guided thoracentesis but since the effusion was loculated she underwent chest tube placement with intrapleural thrombolysis. -Post chest tube placement patient's symptoms have improved. Patient's chest tube was removed on 03/10/2014. She has no residual pneumothorax. Her pleural effusion has significantly reduced. She is no longer hypoxic and therefore being discharged.  2. Pneumonia-this was the cause of patient's acute respiratory failure with hypoxia with a parapneumonic effusion. Patient cultures have all the remaining negative. While in the hospital patient was treated with IV ceftriaxone, Zithromax. She has almost finished a  ten-day course of antibiotics. I'm discharging her on 3 more days of oral Levaquin.  3. Atrial fibrillation-patient developed A. fib while in the hospital. This was likely secondary to her respiratory issues as mentioned above. She has no previous history of atrial fibrillation. -She remains rate controlled. I discussed with her about the possibility of being started on anticoagulation but she would likely prefer to go back to [place] and talk to her cardiologist before initiating anything. She will continue baby aspirin for now.  4. Essential hypertension-patient will resume her bisoprolol, losartan, Norvasc.  5. Diabetes type 2 without complication-patient will continue her metformin, Trulicity, Farxiga  6. Hyperlipidemia - pt. Will cont. Her Crestor.   Pt. Is being discharged home in stable condition and she plans on flying back to [place] on Monday and follow up with her physicians there.  She should follow up with Pulmonary, Cardiology and also her PCP.   DISCHARGE CONDITIONS:   Stable.    CONSULTS OBTAINED:    DRUG ALLERGIES:   Allergies  Allergen Reactions  . Codeine   . Latex Hives and Swelling    DISCHARGE MEDICATIONS:   Allergies as of 03/11/2017      Reactions   Codeine    Latex Hives, Swelling      Medication List    TAKE these medications   amLODipine 10 MG tablet Commonly known as:  NORVASC Take 1 tablet by mouth daily. In the morning   aspirin EC 81 MG tablet Take 81 mg by mouth daily.   bisoprolol 10 MG tablet Commonly known as:  ZEBETA Take 10 mg by mouth daily. In the afternoon   cholecalciferol 1000 units tablet Commonly known as:  VITAMIN D Take 2,000 Units by mouth  daily. In the morning   FARXIGA 10 MG Tabs tablet Generic drug:  dapagliflozin propanediol Take 10 mg by mouth daily. In the morning   hydrochlorothiazide 12.5 MG tablet Commonly known as:  HYDRODIURIL Take 1 tablet by mouth daily. In the afternoon   levofloxacin 500 MG  tablet Commonly known as:  LEVAQUIN Take 1 tablet (500 mg total) by mouth daily.   losartan 100 MG tablet Commonly known as:  COZAAR Take 100 mg by mouth daily. In the evening   meloxicam 15 MG tablet Commonly known as:  MOBIC meloxicam 15 mg tablet  TAKE 1 TABLET BY MOUTH EVERY DAY   metFORMIN 1000 MG tablet Commonly known as:  GLUCOPHAGE metformin 1,000 mg tablet  TAKE 1 TABLET TWICE A DAY WITH MEALS   nitroGLYCERIN 0.4 MG SL tablet Commonly known as:  NITROSTAT nitroglycerin 0.4 mg sublingual tablet  PLACE 1 TABLET UNDER TONGUE ONCE A DAY IF CHEST PAIN RECURS,MAX 1 TABLET IN 24 HOURS PERIOD   predniSONE 10 MG tablet Commonly known as:  DELTASONE Dispense as stated. 5 Pills PO for 1 day then, 4 Pills PO for 1 day, 3 Pills PO for 1 day, 2 Pills PO for 1 day, 1 Pill PO for 1 days then STOP.   rosuvastatin 20 MG tablet Commonly known as:  CRESTOR Take 20 mg by mouth daily. In the evening   TRULICITY 1.5 MG/0.5ML Sopn Generic drug:  Dulaglutide Inject 0.5 mLs into the skin once a week.         DISCHARGE INSTRUCTIONS:   DIET:  Cardiac diet and Diabetic diet  DISCHARGE CONDITION:  Stable  ACTIVITY:  Activity as tolerated  OXYGEN:  Home Oxygen: No.   Oxygen Delivery: room air  DISCHARGE LOCATION:  home   If you experience worsening of your admission symptoms, develop shortness of breath, life threatening emergency, suicidal or homicidal thoughts you must seek medical attention immediately by calling 911 or calling your MD immediately  if symptoms less severe.  You Must read complete instructions/literature along with all the possible adverse reactions/side effects for all the Medicines you take and that have been prescribed to you. Take any new Medicines after you have completely understood and accpet all the possible adverse reactions/side effects.   Please note  You were cared for by a hospitalist during your hospital stay. If you have any questions about  your discharge medications or the care you received while you were in the hospital after you are discharged, you can call the unit and asked to speak with the hospitalist on call if the hospitalist that took care of you is not available. Once you are discharged, your primary care physician will handle any further medical issues. Please note that NO REFILLS for any discharge medications will be authorized once you are discharged, as it is imperative that you return to your primary care physician (or establish a relationship with a primary care physician if you do not have one) for your aftercare needs so that they can reassess your need for medications and monitor your lab values.     Today   No acute events overnight. Denies any chest pain or shortness of breath. Afebrile. We'll discharge home today.  VITAL SIGNS:  Blood pressure (!) 151/79, pulse 85, temperature 97.6 F (36.4 C), temperature source Oral, resp. rate 20, height 5\' 5"  (1.651 m), weight 109.1 kg (240 lb 8 oz), SpO2 94 %.  I/O:  No intake or output data in the 24 hours ending  03/11/17 1342  PHYSICAL EXAMINATION:  GENERAL:  61 y.o.-year-old patient lying in the bed with no acute distress.  EYES: Pupils equal, round, reactive to light and accommodation. No scleral icterus. Extraocular muscles intact.  HEENT: Head atraumatic, normocephalic. Oropharynx and nasopharynx clear.  NECK:  Supple, no jugular venous distention. No thyroid enlargement, no tenderness.  LUNGS: Normal breath sounds bilaterally, no wheezing, rales,rhonchi. No use of accessory muscles of respiration. CT removal site looks good, no drainage noted on dressing.  CARDIOVASCULAR: S1, S2 normal. No murmurs, rubs, or gallops.  ABDOMEN: Soft, non-tender, non-distended. Bowel sounds present. No organomegaly or mass.  EXTREMITIES: No pedal edema, cyanosis, or clubbing.  NEUROLOGIC: Cranial nerves II through XII are intact. No focal motor or sensory defecits b/l.   PSYCHIATRIC: The patient is alert and oriented x 3.  SKIN: No obvious rash, lesion, or ulcer.   DATA REVIEW:   CBC Recent Labs  Lab 03/10/17 0404  WBC 19.0*  HGB 12.9  HCT 40.1  PLT 540*    Chemistries  Recent Labs  Lab 03/10/17 0404  NA 134*  K 4.4  CL 101  CO2 24  GLUCOSE 295*  BUN 45*  CREATININE 0.58  CALCIUM 8.6*  MG 2.2    Cardiac Enzymes No results for input(s): TROPONINI in the last 168 hours.  Microbiology Results  Results for orders placed or performed during the hospital encounter of 03/02/17  Blood culture (routine x 2)     Status: None   Collection Time: 03/02/17 11:06 PM  Result Value Ref Range Status   Specimen Description BLOOD LT FOREARM  Final   Special Requests   Final    BOTTLES DRAWN AEROBIC AND ANAEROBIC Blood Culture results may not be optimal due to an excessive volume of blood received in culture bottles   Culture NO GROWTH 5 DAYS  Final   Report Status 03/07/2017 FINAL  Final  Blood culture (routine x 2)     Status: None   Collection Time: 03/02/17 11:06 PM  Result Value Ref Range Status   Specimen Description BLOOD RT HAND  Final   Special Requests   Final    BOTTLES DRAWN AEROBIC AND ANAEROBIC Blood Culture adequate volume   Culture NO GROWTH 5 DAYS  Final   Report Status 03/07/2017 FINAL  Final  Urine Culture     Status: Abnormal   Collection Time: 03/02/17 11:53 PM  Result Value Ref Range Status   Specimen Description URINE, RANDOM  Final   Special Requests Normal  Final   Culture MULTIPLE SPECIES PRESENT, SUGGEST RECOLLECTION (A)  Final   Report Status 03/04/2017 FINAL  Final  Culture, expectorated sputum-assessment     Status: None   Collection Time: 03/05/17  5:50 PM  Result Value Ref Range Status   Specimen Description EXPECTORATED SPUTUM  Final   Special Requests Normal  Final   Sputum evaluation THIS SPECIMEN IS ACCEPTABLE FOR SPUTUM CULTURE  Final   Report Status 03/05/2017 FINAL  Final  Culture, respiratory  (NON-Expectorated)     Status: None   Collection Time: 03/05/17  5:50 PM  Result Value Ref Range Status   Specimen Description EXPECTORATED SPUTUM  Final   Special Requests Normal Reflexed from [id number]  Final   Gram Stain   Final    FEW WBC PRESENT,BOTH PMN AND MONONUCLEAR RARE SQUAMOUS EPITHELIAL CELLS PRESENT RARE GRAM POSITIVE COCCI IN CLUSTERS    Culture   Final    Consistent with normal respiratory flora. Performed at [facility], [address], [place], [place] [address]    Report Status 03/08/2017 FINAL  Final  MRSA PCR Screening     Status: None   Collection Time: 03/06/17 12:03 PM  Result Value Ref Range Status   MRSA by PCR NEGATIVE NEGATIVE Final    Comment:        The GeneXpert MRSA Assay (FDA approved for NASAL specimens only), is one component of a comprehensive MRSA colonization surveillance program. It is not intended to diagnose MRSA infection nor to guide or monitor treatment for MRSA infections.   Body fluid culture     Status: None   Collection Time: 03/06/17  3:28 PM  Result Value Ref Range Status   Specimen Description PLEURAL PLEURAL/PERITONEAL FLUID  Final   Special Requests Normal  Final   Gram Stain   Final    FEW WBC PRESENT, PREDOMINANTLY PMN NO ORGANISMS SEEN    Culture   Final    NO GROWTH 3 DAYS Performed at [facility], [address] [place], [place] [address]    Report Status 03/10/2017 FINAL  Final    RADIOLOGY:  Dg Chest 2 View  Result Date: 03/10/2017 CLINICAL DATA:  Follow-up pneumonia EXAM: CHEST  2 VIEW COMPARISON:  03/09/2017 CT of the chest FINDINGS: Previously seen left-sided chest tube is been removed. No new pneumothorax is identified. Bibasilar atelectatic changes are again seen. No sizable effusion is noted. No acute bony abnormality is seen. IMPRESSION: No pneumothorax following chest tube removal. Stable bibasilar changes. Electronically Signed   By: Alcide Clever M.D.   On: 03/10/2017 08:51       Management plans discussed with the patient, family and they are in agreement.  CODE STATUS:     Code Status Orders  (From admission, onward)        Start     Ordered   03/04/17 0840  Full code  Continuous     03/04/17 0839    Code Status History    Date Active Date Inactive Code Status Order ID Comments User Context   This patient has a current code status but no historical code status.      TOTAL TIME TAKING CARE OF THIS PATIENT: 40 minutes.    Houston Siren M.D on 03/11/2017 at 1:42 PM  Between 7am to 6pm - Pager - [phone]  After 6pm go to [email or web address] - Social research officer, government  [facility]  Office  [phone]  CC: Primary care physician; System, Pcp Not In

## 2017-03-11 NOTE — Progress Notes (Signed)
IV and tele were removed. Discharge instructions were provided to the pt. Medications were sent to [facility] [address] The pt is waiting for uber to provide transportation to her hotel.

## 2018-03-28 IMAGING — CR DG CHEST 2V
1 series · 2 of 2 positions shown · non-contrast
Comparison: 03/09/2017 CT of the chest

CLINICAL DATA: Follow-up pneumonia

EXAM:
CHEST  2 VIEW

[Series 1: dg chest 2 view · 0.14mm/px · 2 of 2 slices shown]
[im 1/2]
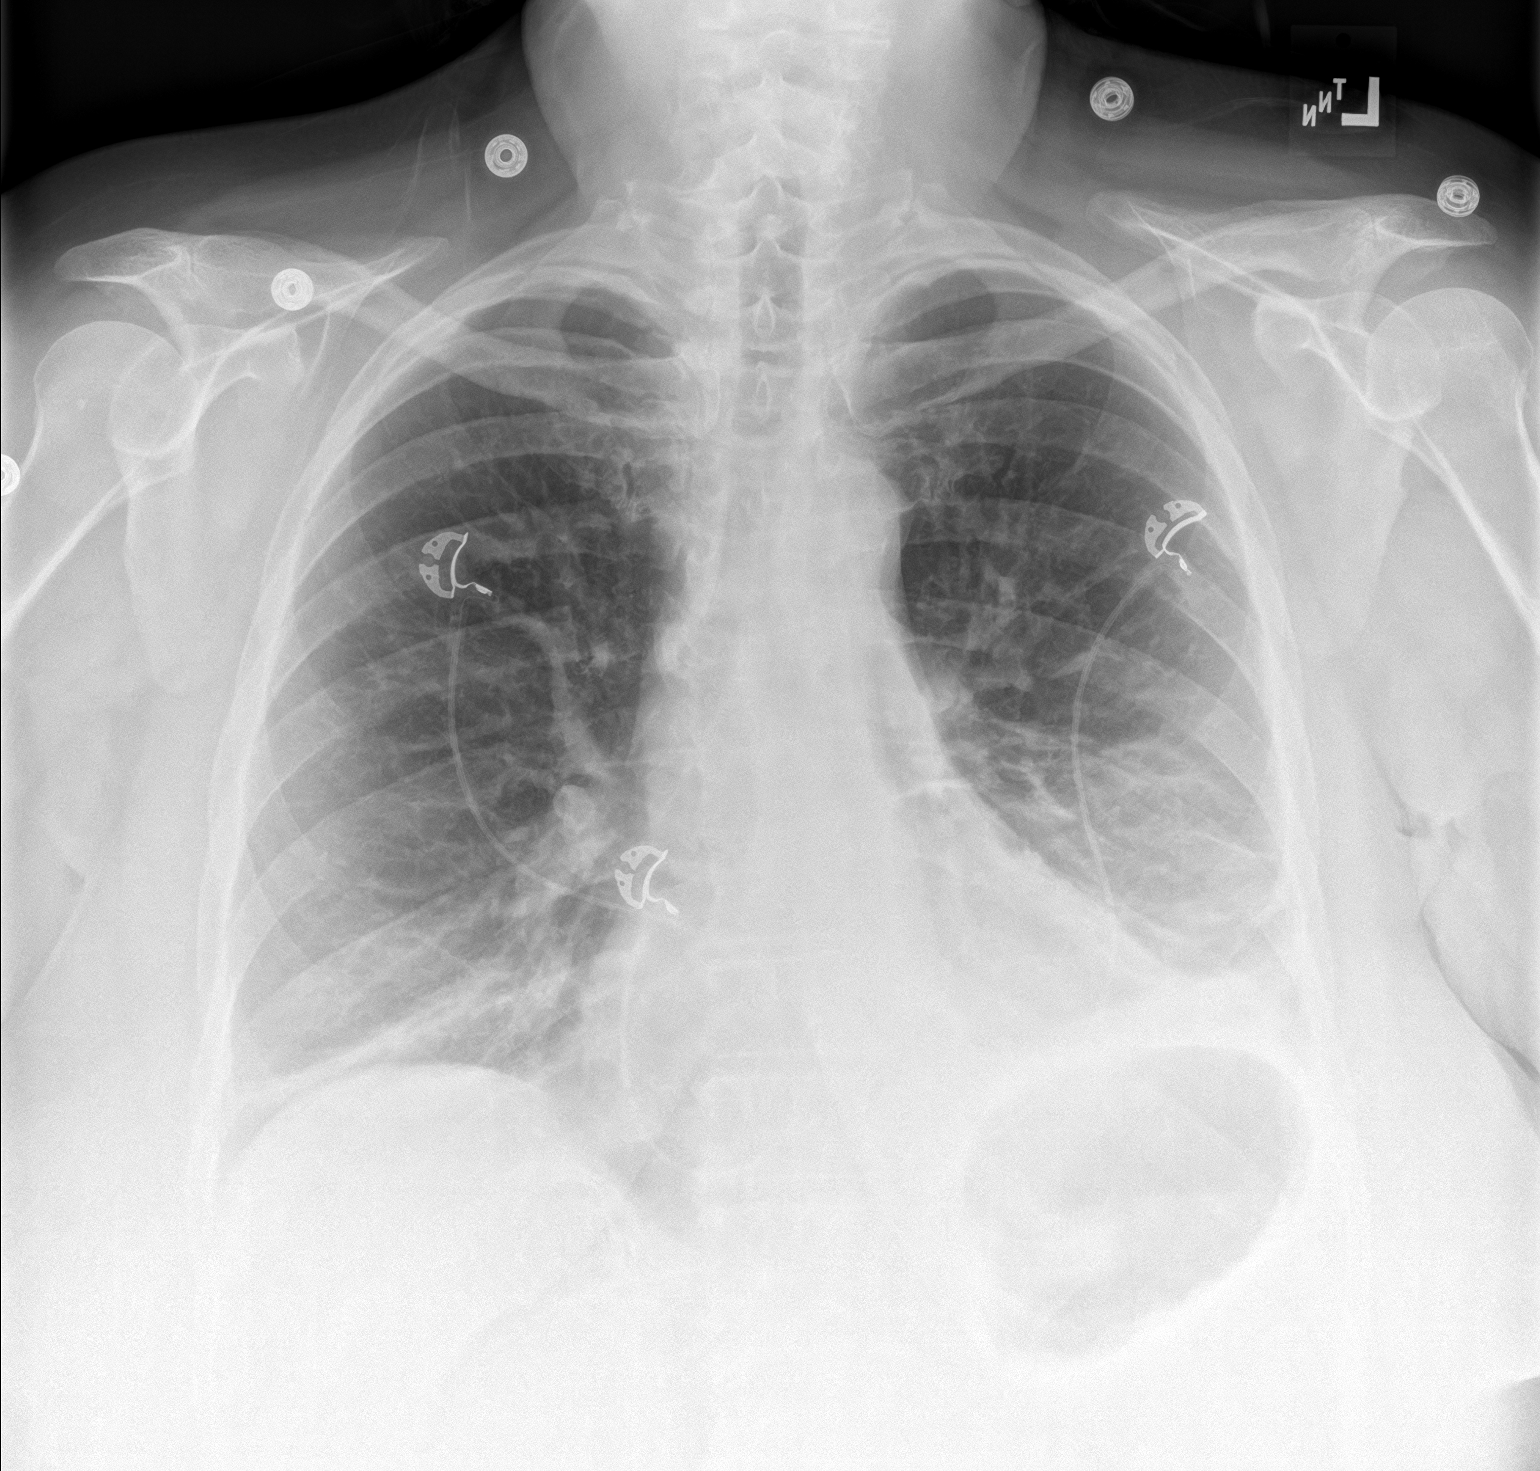
[im 2/2]
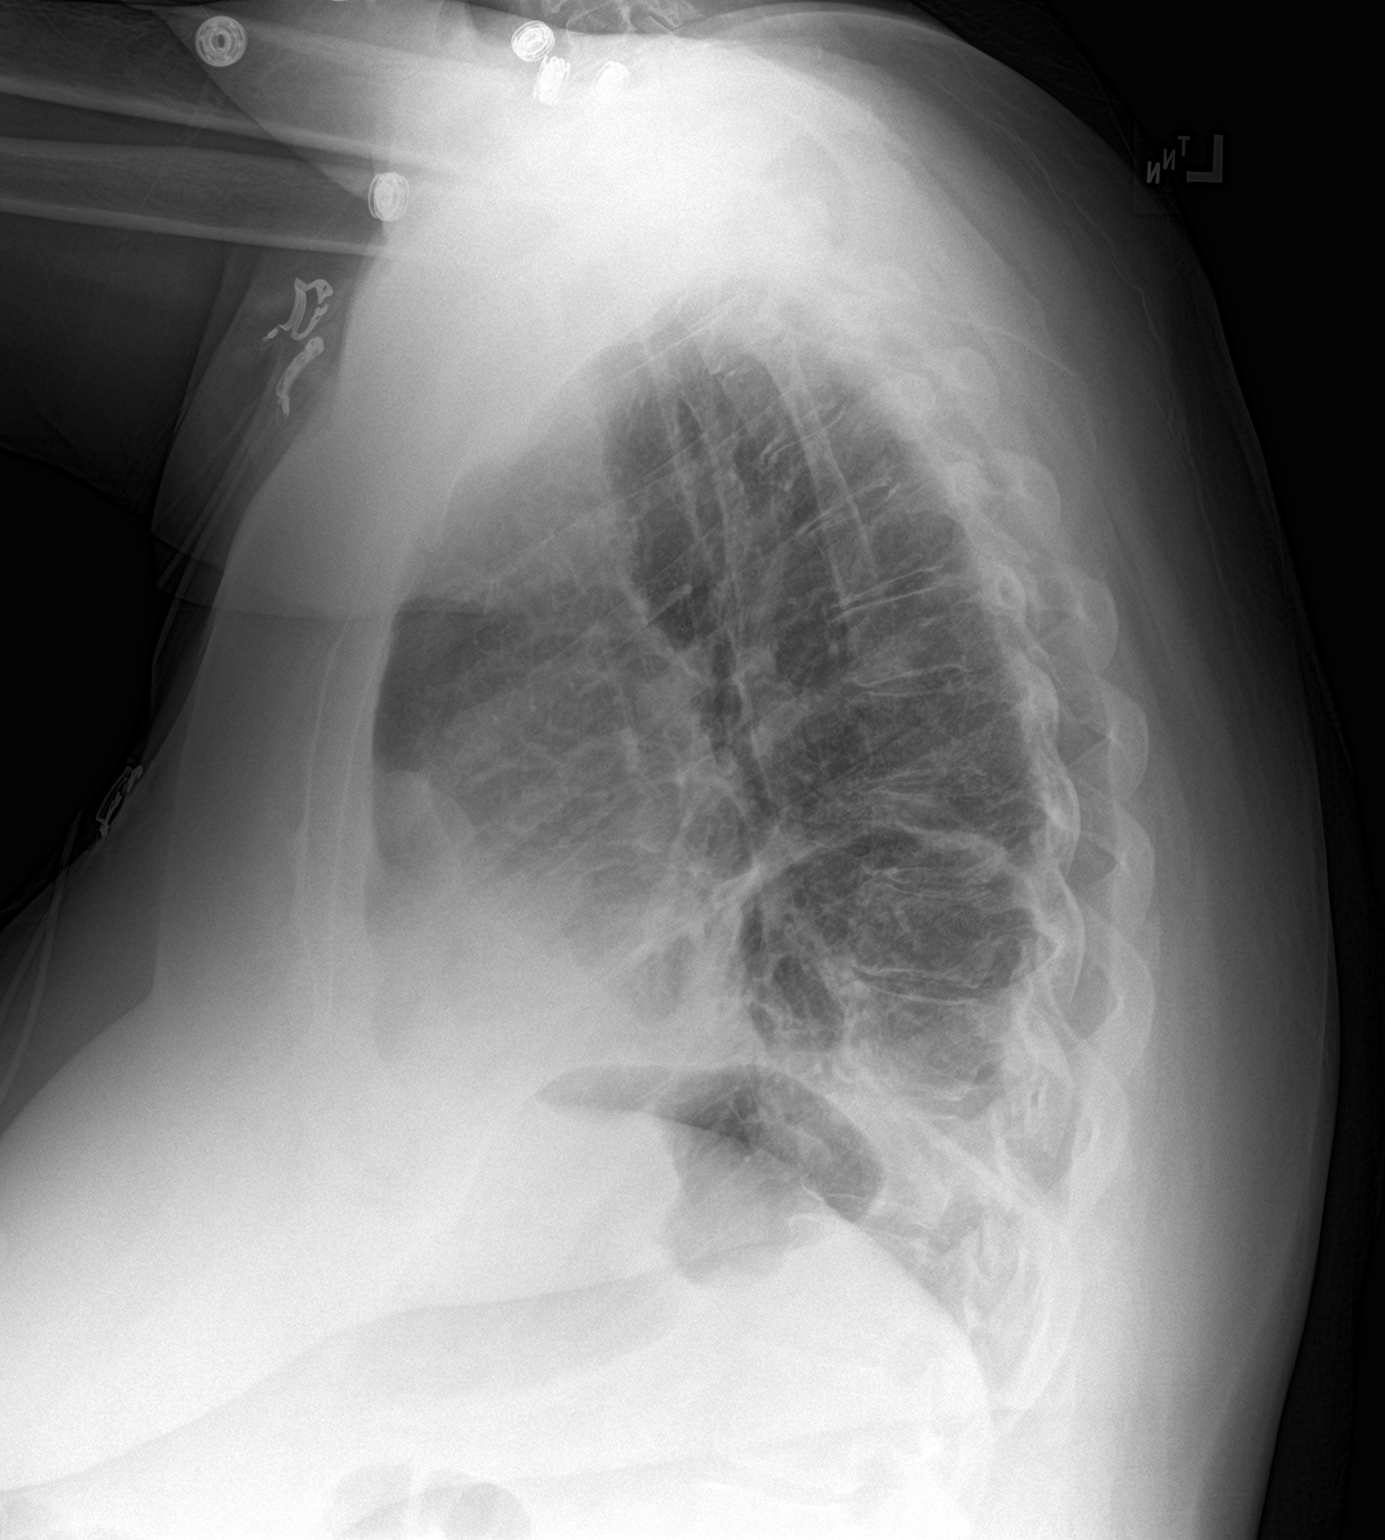

[2 of 2 positions shown; findings below may reference images not displayed]

FINDINGS: Previously seen left-sided chest tube is been removed. No new
pneumothorax is identified. Bibasilar atelectatic changes are again
seen. No sizable effusion is noted. No acute bony abnormality is
seen.
IMPRESSION: No pneumothorax following chest tube removal.

Stable bibasilar changes.
